# Patient Record
Sex: Male | Born: 1955 | Race: Black or African American | Hispanic: No | Marital: Married | Smoking: Former smoker
Health system: Southern US, Community
[De-identification: ages and names within clinical notes are randomized; demographics above are authoritative.]

## PROBLEM LIST (undated history)

## (undated) DIAGNOSIS — E7800 Pure hypercholesterolemia, unspecified: Secondary | ICD-10-CM

## (undated) DIAGNOSIS — M1990 Unspecified osteoarthritis, unspecified site: Secondary | ICD-10-CM

## (undated) DIAGNOSIS — K219 Gastro-esophageal reflux disease without esophagitis: Secondary | ICD-10-CM

## (undated) HISTORY — PX: KNEE SURGERY: SHX244

## (undated) HISTORY — PX: HERNIA REPAIR: SHX51

## (undated) HISTORY — PX: FOOT SURGERY: SHX648

---

## 2003-10-15 ENCOUNTER — Encounter: Admission: RE | Admit: 2003-10-15 | Discharge: 2003-10-15 | Payer: Self-pay | Admitting: Vascular Surgery

## 2004-06-26 HISTORY — PX: KNEE SURGERY: SHX244

## 2004-11-29 ENCOUNTER — Encounter: Admission: RE | Admit: 2004-11-29 | Discharge: 2004-11-29 | Payer: Self-pay | Admitting: Family Medicine

## 2005-12-21 ENCOUNTER — Encounter: Admission: RE | Admit: 2005-12-21 | Discharge: 2005-12-21 | Payer: Self-pay | Admitting: Family Medicine

## 2006-01-08 ENCOUNTER — Emergency Department (HOSPITAL_COMMUNITY): Admission: EM | Admit: 2006-01-08 | Discharge: 2006-01-08 | Payer: Self-pay | Admitting: Emergency Medicine

## 2006-03-13 ENCOUNTER — Ambulatory Visit (HOSPITAL_BASED_OUTPATIENT_CLINIC_OR_DEPARTMENT_OTHER): Admission: RE | Admit: 2006-03-13 | Discharge: 2006-03-13 | Payer: Self-pay | Admitting: Orthopedic Surgery

## 2007-09-02 ENCOUNTER — Ambulatory Visit (HOSPITAL_BASED_OUTPATIENT_CLINIC_OR_DEPARTMENT_OTHER): Admission: RE | Admit: 2007-09-02 | Discharge: 2007-09-02 | Payer: Self-pay | Admitting: Urology

## 2010-11-08 NOTE — Op Note (Signed)
Edwin [name], Edwin [name]               ACCOUNT NO.:  [id number]   MEDICAL RECORD NO.:  [id number]          PATIENT TYPE:  AMB   LOCATION:  [facility]                         FACILITY:  [facility]   PHYSICIAN:  Bertram Millard. Dahlstedt, M.D.DATE OF BIRTH:  09/16/1955   DATE OF PROCEDURE:  09/02/2007  DATE OF DISCHARGE:                               OPERATIVE REPORT   PREOPERATIVE DIAGNOSIS:  Phimosis.   POSTOPERATIVE DIAGNOSIS:  Phimosis.   PROCEDURE:  Circumcision.   ATTENDING PHYSICIAN:  Dr. Bertram Millard. Dahlstedt.   RESIDENT PHYSICIAN:  Dr. Nira Conn.   ANESTHESIA:  Local plus sedation.   INDICATIONS FOR PROCEDURE:  [name] is a 55 year old  African/American male who is uncircumcised.  He states that he has been  having difficulty retracting his foreskin and also has issues with  tearing of his foreskin during sexual intercourse, which requires one  week to two weeks to heal.  There is no history of any urinary  difficulty.  He was seen by Dr. Retta Diones preoperatively and recommended  to have a circumcision.  All risks and benefits were explained to the  patient, including bleeding, infection, damage to the urethra, removal  of too little or too much foreskin.  An informed consent was obtained.   DESCRIPTION OF PROCEDURE:  The patient is brought to the operating room  and placed in the supine position.  He was correctly identified by his  wrist band and an appropriate time out was taken.  Sedation anesthesia  was delivered.  Once adequately anesthetized, his perineum was prepped  and draped sterilely.  We began our procedure by performing a dorsal  nerve block.  We then closely inspected the phallus and made our  proximal and distal markings for incisions.  We tested his sensation  with a pair of pickups.  It appeared as if his dorsal block was  incomplete and likewise we injected some lidocaine locally at the level  of the distal incision line.  Once adequately anesthetized, we then  performed a distal circumcising incision and bluntly dissected the  superficial fascial layers and subsequently performed a proximal  circumcising incision as well.  A tissue plane was created with blunt  dissection, connecting the proximal and distal incisions on the dorsal  surface.  This was connected using Bovie electrocautery.  We then  removed the intervening segment of foreskin with electrocautery.  We  then passed the foreskin off the table.  Then we addressed any bleeding  sites with electrocautery.  Once the surgical site was hemostatic, we  then placed four quadrant sutures at 12, 3, 6 and 9 o'clock.  On the  ventral surface we reapproximated the midline with a U-stitch.  The  others were simple interrupted sutures using #4-0 chromic.  We then  closed all the wounds in between the quadrant sutures with simple  running sutures, using #4-0 chromic.  Once the wound was closed, we re-  inspected the incision.  It was hemostatic.  Subsequently we placed a  dressing using Vaseline gauze and regular cotton gauze.  This marked the end of our procedure.  Anesthesia  was stopped.  He awoke  and was taken to the PACU in stable condition.  He tolerated the  procedure well.  There were no complications.   Dr. Retta Diones was present and participated in all aspects of the case.     ______________________________  Nira Conn, Res      Bertram Millard. Dahlstedt, M.D.  Electronically Signed    DW/MEDQ  D:  09/02/2007  T:  09/02/2007  Job:  [id number]

## 2010-11-11 NOTE — Op Note (Signed)
NAMERAMEY, SCHIFF               ACCOUNT NO.:  [id number]   MEDICAL RECORD NO.:  [id number]          PATIENT TYPE:  AMB   LOCATION:  [facility]                          FACILITY:  [facility]   PHYSICIAN:  Deidre Ala, M.D.    DATE OF BIRTH:  09-Apr-1956   DATE OF PROCEDURE:  DATE OF DISCHARGE:                                 OPERATIVE REPORT   PREOPERATIVE DIAGNOSIS:  Degenerative medial and lateral meniscus tears.   POSTOPERATIVE DIAGNOSES:  1. Posterior horn degenerative stellate unstable medial and lateral      meniscus tears.  2. Tight lateral retinaculum.  3. Chondromalacia patella.  4. 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  5. Medial and lateral plicas.   OPERATION:  1. Right knee operative arthroscopy with partial posterior medial and      lateral meniscectomies.  2. Arthroscopic lateral right retinacular release.  3. Medial and lateral plica excisions.  4. Abrasion ablation chondroplasty medial femoral condyle.   SURGEON:  Doristine Section, MD   ASSISTANT:  Clarene Reamer, [facility]   ANESTHESIA:  General with LMA.   CULTURES:  None.   DRAINS:  None.   ESTIMATED BLOOD LOSS:  Minimal.   TOURNIQUET TIME:  40 minutes.   PATHOLOGIC FINDINGS AND HISTORY:  Mr. Hillyard is a 55 year old sent for  consultation by Dr. Lupe Carney of [facility] for knee pain  after a twisting injury.  He had knee pain.  We sent him for an MRI scan on  December 10, 2005.  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  There was an extensive irregular tear of the posterior horn  of the body of the lateral meniscus and a slightly less irregular tear of  the posterior horn of the medial meniscus.  He came back January 17, 2006, and  was feeling better but by February 16, 2006, he was still having pain on the  medial aspect of the knee, popping, not giving out on him but swelling  periodically, especially at the end of the day.  At this point, we discussed  his situation, and he elected to proceed with diagnostic and operative  arthroscopy.  He did have a stellate unstable posterior 1/3-1/2 medial  meniscus tear and a degenerative unstable lateral meniscus tear posterior  1/2 that debrided all the way to the rim with the popliteus still intact.  ACL was slightly stretched but not discontinuous.  There were large medial  and lateral plicas.  The trochlear cartilage looked good.  There was a plica  rubbing on the medial femoral condyle.  There was DJD grade 2-3 on the far  posterior horn medial femoral condyle.  There was grade 1 chondromalacia of  the posterior patella with a tight lateral retinaculum.  We did a lateral  retinacular release, plica excisions, abrasion chondroplasty on the medial  femoral condyle, and partial medial and lateral meniscectomies.   PROCEDURE:  With adequate anesthesia obtained using LMA  technique, 1 gram  Ancef given IV prophylaxis, the patient was placed in the supine position.  The right lower extremity was prepped from the malleoli to the leg holder in  the standard fashion.  After standard prepping and draping, Esmarch  examination was used.  The tourniquet was let up to 350 mmHg.  Superior and  lateral inflow portal was made.  The knee was insufflated with normal saline  with an arthroscopic pump.  Medial and lateral scope portals were then made,  and the joint was thoroughly inspected.  I then shaved out the medial plica  back to the sidewall and lysed the medial band.  I then exposed the medial  meniscus tear and used basket and shaver to saucerize it back to the far  posterior rim including the far posterior horn.  I then checked the ACL.  I  then reversed portals and used basket and shaver to saucerize and removed  the posterior horn lateral meniscus tear.   Lateral plica was then excised.  I then checked patellar tilt and track.  Lateral retinacular release was  carried out from vastus lateralis to the joint line with improved tracking.  Bleeding points were cauterized.  The knee was then irrigated through the  scope, and 0.5% Marcaine with morphine was injected in the joint.  The  portals were left open.  A bulky sterile compressive dressing was applied  with lateral foam pad for tamponade and Easy Wrap placed.  The patient then  having tolerated the procedure well, was awakened and taken to the recovery  room in satisfactory condition to be discharged per outpatient routine,  given Percocet for pain, and told call the office for recheck tomorrow.           ______________________________  V. Charlesetta Shanks, M.D.     VEP/MEDQ  D:  03/13/2006  T:  03/14/2006  Job:  [id number]   cc:   L. Lupe Carney, M.D.  Fax: [phone]

## 2011-03-20 LAB — POCT HEMOGLOBIN-HEMACUE
Hemoglobin: 15.1
Operator id: 268271

## 2015-02-02 ENCOUNTER — Emergency Department (HOSPITAL_COMMUNITY)
Admission: EM | Admit: 2015-02-02 | Discharge: 2015-02-03 | Disposition: A | Discharge status: Home / Self Care | Payer: Commercial Managed Care - HMO | Attending: Emergency Medicine | Admitting: Emergency Medicine

## 2015-02-02 ENCOUNTER — Encounter (HOSPITAL_COMMUNITY): Payer: Self-pay | Admitting: *Deleted

## 2015-02-02 DIAGNOSIS — Z87891 Personal history of nicotine dependence: Secondary | ICD-10-CM | POA: Diagnosis not present

## 2015-02-02 DIAGNOSIS — Z8639 Personal history of other endocrine, nutritional and metabolic disease: Secondary | ICD-10-CM | POA: Diagnosis not present

## 2015-02-02 DIAGNOSIS — M25511 Pain in right shoulder: Secondary | ICD-10-CM | POA: Diagnosis present

## 2015-02-02 HISTORY — DX: Pure hypercholesterolemia, unspecified: E78.00

## 2015-02-02 NOTE — ED Notes (Signed)
Denies SOB, CP 

## 2015-02-02 NOTE — ED Notes (Signed)
Patient presents stating that about 8 tonight he started with right shoulder pain that goes down his arm and and down his back.  Good grip

## 2015-02-02 NOTE — ED Provider Notes (Signed)
CSN: [id number]   Arrival date & time 02/02/15 2301  History  This chart was scribed for non-physician practitioner, Okey Regal PA-C , working with Ezequiel Essex, MD by Altamease Oiler, ED Scribe. This patient was seen in room TR11C/TR11C and the patient's care was started at 11:33 PM.  Chief Complaint  Patient presents with  . Shoulder Pain    HPI The history is provided by the patient. No language interpreter was used.   Edwin [name] is a 59 y.o. male who presents to the Emergency Department complaining of new and constant right shoulder pain with sudden onset around 8 PM tonight when he stood up from the dinner table. The pain radiates down the RUE and is worse with movement. He rates the pain 10/10 in severity and the hydrocodone that he usually uses for arthritis in his knees provided insufficient shoulder pain relief at home. Pt denies recent heavy lifting and trauma. No chest pain, SOB, abdominal pain, nausea, vomiting, or diarrhea. No cardiac history. Pt denies tobacco use. No history of gout. No history of kidney disease. He usually sees Dr. Eddie Dibbles for ortho.   Past Medical History  Diagnosis Date  . Hypercholesteremia     Past Surgical History  Procedure Laterality Date  . Knee surgery    . Hernia repair    . Foot surgery      No family history on file.  Social History  Substance Use Topics  . Smoking status: Former Research scientist (life sciences)  . Smokeless tobacco: Never Used  . Alcohol Use: Yes     Review of Systems  All other systems reviewed and are negative.   Home Medications   Prior to Admission medications   Not on File    Allergies  Percocet  Triage Vitals: BP 135/70 mmHg  Pulse 78  Temp(Src) 98.9 F (37.2 C) (Oral)  Resp 16  Ht 6' (1.829 m)  Wt 228 lb (103.42 kg)  BMI 30.92 kg/m2  SpO2 95%  Physical Exam  Constitutional: He is oriented to person, place, and time. He appears well-developed and well-nourished.  HENT:  Head: Normocephalic.  Eyes: EOM are  normal.  Neck: Normal range of motion.  Pulmonary/Chest: Effort normal.  Abdominal: He exhibits no distension.  Musculoskeletal: Normal range of motion.  Shoulders symmetrical No obvious right shoulder swelling or deformity TTP of anterior aspect of the right shoulder Significant pain with any ROM of the shoulder Sensation grossly intact Radial pulses 2+ Grip strength 5/5 Capillary refill less than 2 seconds  Neurological: He is alert and oriented to person, place, and time.  Psychiatric: He has a normal mood and affect.  Nursing note and vitals reviewed.   ED Course  Procedures  Labs Review- Labs Reviewed - No data to display  Imaging Review No results found.  EKG Interpretation None      MDM   Final diagnoses:  Right shoulder pain     Labs  Imaging: Right shoulder XR- patient originally requested and then denied this  Consults   Therapeutics:   Assessment: Patient presents with right shoulder pain, this is likely muscular in nature. He has no cardiac symptoms, pain worsened with range of motion of the shoulder. Distal sensation and strength in function intact of the extremity. Patient was instructed to use rest, ice, ibuprofen or Tylenol as needed for pain. He is instructed follow-up with his primary care provider orthopedic surgeon for further evaluation and management. He was given strict return precautions. Patient originally requested an x-ray, but  refused this at the time of evaluation.  Plan:  I personally performed the services described in this documentation, which was scribed in my presence. The recorded information has been reviewed and is accurate.     Okey Regal, PA-C 02/03/15 1609  Everlene Balls, MD 02/03/15 [phone]

## 2015-02-03 ENCOUNTER — Emergency Department (HOSPITAL_COMMUNITY): Payer: Commercial Managed Care - HMO

## 2015-02-03 NOTE — ED Notes (Signed)
Pt. Left with all belongings and refused wheelchair. Discharge instructions were reviewed and all questions were answered.  

## 2015-02-03 NOTE — Discharge Instructions (Signed)
Arthralgia Arthralgia is joint pain. A joint is a place where two bones meet. Joint pain can happen for many reasons. The joint can be bruised, stiff, infected, or weak from aging. Pain usually goes away after resting and taking medicine for soreness.  HOME CARE  Rest the joint as told by your doctor.  Keep the sore joint raised (elevated) for the first 24 hours.  Put ice on the joint area.  Put ice in a plastic bag.  Place a towel between your skin and the bag.  Leave the ice on for 15-20 minutes, 03-04 times a day.  Wear your splint, casting, elastic bandage, or sling as told by your doctor.  Only take medicine as told by your doctor. Do not take aspirin.  Use crutches as told by your doctor. Do not put weight on the joint until told to by your doctor. GET HELP RIGHT AWAY IF:   You have bruising, puffiness (swelling), or more pain.  Your fingers or toes turn blue or start to lose feeling (numb).  Your medicine does not lessen the pain.  Your pain becomes severe.  You have a temperature by mouth above 102 F (38.9 C), not controlled by medicine.  You cannot move or use the joint. MAKE SURE YOU:   Understand these instructions.  Will watch your condition.  Will get help right away if you are not doing well or get worse. Document Released: 05/31/2009 Document Revised: 09/04/2011 Document Reviewed: 05/31/2009 [employer] Patient Information 2015 [place], [place]. This information is not intended to replace advice given to you by your health care provider. Make sure you discuss any questions you have with your health care provider.  Please follow-up with orthopedic specialist for further evaluation and management. Please use ibuprofen or Tylenol as needed for the pain. Please return immediately if new worsening signs or symptoms present.

## 2016-03-16 ENCOUNTER — Other Ambulatory Visit: Payer: Self-pay | Admitting: Family Medicine

## 2016-03-16 ENCOUNTER — Ambulatory Visit
Admission: RE | Admit: 2016-03-16 | Discharge: 2016-03-16 | Disposition: A | Discharge status: Home / Self Care | Payer: Commercial Managed Care - HMO | Source: Ambulatory Visit | Attending: Family Medicine | Admitting: Family Medicine

## 2016-03-16 DIAGNOSIS — M79644 Pain in right finger(s): Secondary | ICD-10-CM

## 2016-07-25 ENCOUNTER — Encounter (HOSPITAL_COMMUNITY): Payer: Self-pay

## 2016-07-25 ENCOUNTER — Emergency Department (HOSPITAL_COMMUNITY): Payer: Commercial Managed Care - HMO

## 2016-07-25 ENCOUNTER — Telehealth (HOSPITAL_COMMUNITY): Payer: Self-pay | Admitting: *Deleted

## 2016-07-25 ENCOUNTER — Emergency Department (HOSPITAL_COMMUNITY)
Admission: EM | Admit: 2016-07-25 | Discharge: 2016-07-25 | Disposition: A | Discharge status: Home / Self Care | Payer: Commercial Managed Care - HMO | Attending: Emergency Medicine | Admitting: Emergency Medicine

## 2016-07-25 DIAGNOSIS — M5416 Radiculopathy, lumbar region: Secondary | ICD-10-CM | POA: Diagnosis not present

## 2016-07-25 DIAGNOSIS — R079 Chest pain, unspecified: Secondary | ICD-10-CM | POA: Diagnosis not present

## 2016-07-25 DIAGNOSIS — Z87891 Personal history of nicotine dependence: Secondary | ICD-10-CM | POA: Insufficient documentation

## 2016-07-25 DIAGNOSIS — I4891 Unspecified atrial fibrillation: Secondary | ICD-10-CM | POA: Insufficient documentation

## 2016-07-25 DIAGNOSIS — Z7901 Long term (current) use of anticoagulants: Secondary | ICD-10-CM | POA: Insufficient documentation

## 2016-07-25 DIAGNOSIS — R000 Tachycardia, unspecified: Secondary | ICD-10-CM | POA: Diagnosis not present

## 2016-07-25 DIAGNOSIS — Z79899 Other long term (current) drug therapy: Secondary | ICD-10-CM | POA: Insufficient documentation

## 2016-07-25 LAB — CBC
HEMATOCRIT: 46.4 % (ref 39.0–52.0)
HEMOGLOBIN: 15.5 g/dL (ref 13.0–17.0)
MCH: 27.9 pg (ref 26.0–34.0)
MCHC: 33.4 g/dL (ref 30.0–36.0)
MCV: 83.5 fL (ref 78.0–100.0)
Platelets: 207 10*3/uL (ref 150–400)
RBC: 5.56 MIL/uL (ref 4.22–5.81)
RDW: 13.7 % (ref 11.5–15.5)
WBC: 7.6 10*3/uL (ref 4.0–10.5)

## 2016-07-25 LAB — BASIC METABOLIC PANEL
ANION GAP: 10 (ref 5–15)
BUN: 18 mg/dL (ref 6–20)
CO2: 25 mmol/L (ref 22–32)
Calcium: 9.3 mg/dL (ref 8.9–10.3)
Chloride: 104 mmol/L (ref 101–111)
Creatinine, Ser: 1.39 mg/dL — ABNORMAL HIGH (ref 0.61–1.24)
GFR calc Af Amer: >60 mL/min (ref >60)
GFR, EST NON AFRICAN AMERICAN: 53 mL/min — AB (ref >60)
GLUCOSE: 149 mg/dL — AB (ref 65–99)
POTASSIUM: 4.2 mmol/L (ref 3.5–5.1)
Sodium: 139 mmol/L (ref 135–145)

## 2016-07-25 LAB — TROPONIN I: Troponin I: <0.03 ng/mL (ref <0.03)

## 2016-07-25 LAB — MAGNESIUM: MAGNESIUM: 2 mg/dL (ref 1.7–2.4)

## 2016-07-25 MED ORDER — DILTIAZEM HCL ER COATED BEADS 240 MG PO CP24: 240.0000 mg | ORAL_CAPSULE | Freq: Every day | ORAL | 0 refills | Status: DC

## 2016-07-25 MED ORDER — RIVAROXABAN 20 MG PO TABS
20.0000 mg | ORAL_TABLET | Freq: Once | ORAL | Status: AC
  Administered 2016-07-25: 20 mg via ORAL
  Filled 2016-07-25: qty 1

## 2016-07-25 MED ORDER — RIVAROXABAN 20 MG PO TABS: 20.0000 mg | ORAL_TABLET | Freq: Every day | ORAL | 0 refills | Status: DC

## 2016-07-25 MED ORDER — DILTIAZEM HCL 25 MG/5ML IV SOLN
20.0000 mg | Freq: Once | INTRAVENOUS | Status: AC
Start: 2016-07-25 — End: 2016-07-25
  Administered 2016-07-25: 20 mg via INTRAVENOUS
  Filled 2016-07-25: qty 5

## 2016-07-25 MED ORDER — RIVAROXABAN (XARELTO) EDUCATION KIT FOR AFIB PATIENTS
PACK | Freq: Once | Status: AC
  Administered 2016-07-25: 04:00:00
  Filled 2016-07-25: qty 1

## 2016-07-25 NOTE — ED Triage Notes (Signed)
Pt complining of fast heart rate. Pt placed on EKG at triage, tachy in the 140's, reading a-fib. Pt states no hx of same. Pt denies any recent fevers or illnesses. Pt denies any injury/trauma. Pt denies any ETOH or drug use.

## 2016-07-25 NOTE — Telephone Encounter (Signed)
I left msg on pt spouse number.   Pt number rang to [facility] but mailbox was full and not accepting msgs.

## 2016-07-25 NOTE — Telephone Encounter (Signed)
-----   Message from Juluis Mire, RN sent at 07/25/2016  2:21 PM EST -----   ----- Message ----- From: Sherran Needs, NP Sent: 07/25/2016  10:14 AM To: Juluis Mire, RN  Patient was seen in the ED for a fib with RVR for 5 days.  I spoke with Dr. Kirk Ruths who recs for diltizaem, xarelto, and clinic fu within 48 hours.  Labs normal here.   Patient now telling me this is his best contact phone number  [phone]   Wife cell:  [phone]     Thank you for your assistance with this!   Larena Glassman

## 2016-07-25 NOTE — Discharge Instructions (Signed)
Information on my medicine - XARELTO (Rivaroxaban)  This medication education was reviewed with me or my healthcare representative as part of my discharge preparation.  The pharmacist that spoke with me during my hospital stay was:  Sindy Guadeloupe, [facility]  Why was Xarelto prescribed for you? Xarelto was prescribed for you to reduce the risk of a blood clot forming that can cause a stroke if you have a medical condition called atrial fibrillation (a type of irregular heartbeat).  What do you need to know about xarelto ? Take your Xarelto ONCE DAILY at the same time every day with your evening meal. If you have difficulty swallowing the tablet whole, you may crush it and mix in applesauce just prior to taking your dose.  Take Xarelto exactly as prescribed by your doctor and DO NOT stop taking Xarelto without talking to the doctor who prescribed the medication.  Stopping without other stroke prevention medication to take the place of Xarelto may increase your risk of developing a clot that causes a stroke.  Refill your prescription before you run out.  After discharge, you should have regular check-up appointments with your healthcare provider that is prescribing your Xarelto.  In the future your dose may need to be changed if your kidney function or weight changes by a significant amount.  What do you do if you miss a dose? If you are taking Xarelto ONCE DAILY and you miss a dose, take it as soon as you remember on the same day then continue your regularly scheduled once daily regimen the next day. Do not take two doses of Xarelto at the same time or on the same day.   Important Safety Information A possible side effect of Xarelto is bleeding. You should call your healthcare provider right away if you experience any of the following: ? Bleeding from an injury or your nose that does not stop. ? Unusual colored urine (red or dark brown) or unusual colored stools (red or  black). ? Unusual bruising for unknown reasons. ? A serious fall or if you hit your head (even if there is no bleeding).  Some medicines may interact with Xarelto and might increase your risk of bleeding while on Xarelto. To help avoid this, consult your healthcare provider or pharmacist prior to using any new prescription or non-prescription medications, including herbals, vitamins, non-steroidal anti-inflammatory drugs (NSAIDs) and supplements.  This website has more information on Xarelto: [email or web address]

## 2016-07-25 NOTE — ED Notes (Signed)
Waiting for pharmacy to do education.

## 2016-07-25 NOTE — ED Provider Notes (Addendum)
[facility] DEPT Provider Note   CSN: [id number] Arrival date & time: 07/25/16  0121   By signing my name below, I, Eunice Blase, attest that this documentation has been prepared under the direction and in the presence of Everlene Balls, MD. Electronically signed, Eunice Blase, ED Scribe. 07/25/16. 2:27 AM.   History   Chief Complaint Chief Complaint  Patient presents with  . Atrial Fibrillation   The history is provided by the patient and medical records. No language interpreter was used.    HPI Comments: Edwin [name] is a 61 y.o. male with Hx of HLD who presents to the Emergency Department complaining of new, persistent heart palpitations ~4-5 days. Triage note reports tachycardia and A-fib prior to evaluation. He reports mild, occasional episodic chest pain described as "twinges" that subside without intervention. He further reveals he takes Lipitor for HLD and etodolac for pain at home. Pt denies rhinorrhea, cough, fever, N/V/D and Hx of heart problems.no history of this in the past.  Past Medical History:  Diagnosis Date  . Hypercholesteremia     There are no active problems to display for this patient.   Past Surgical History:  Procedure Laterality Date  . FOOT SURGERY    . HERNIA REPAIR    . KNEE SURGERY         Home Medications    Prior to Admission medications   Medication Sig Start Date End Date Taking? Authorizing Provider  atorvastatin (LIPITOR) 20 MG tablet Take 20 mg by mouth daily. 06/28/16  Yes Historical Provider, MD  CIALIS 5 MG tablet Take 10-20 mg by mouth daily as needed for erectile dysfunction.  07/17/16  Yes Historical Provider, MD  etodolac (LODINE) 500 MG tablet Take 500 mg by mouth 2 (two) times daily as needed (for pain).  07/13/16  Yes Historical Provider, MD  diltiazem (CARTIA XT) 240 MG 24 hr capsule Take 1 capsule (240 mg total) by mouth daily. 07/25/16   Everlene Balls, MD  rivaroxaban (XARELTO) 20 MG TABS tablet Take 1 tablet (20 mg  total) by mouth daily with supper. 07/25/16   Everlene Balls, MD    Family History History reviewed. No pertinent family history.  Social History Social History  Substance Use Topics  . Smoking status: Former Research scientist (life sciences)  . Smokeless tobacco: Never Used  . Alcohol use Yes     Allergies   Percocet [oxycodone-acetaminophen]   Review of Systems Review of Systems  All other systems reviewed and are negative.  A complete 10 system review of systems was obtained and all systems are negative except as noted in the HPI and PMH.    Physical Exam Updated Vital Signs BP (!) 141/110   Pulse (!) 140   Temp 98.4 F (36.9 C) (Oral)   Resp 18   SpO2 96%   Physical Exam  Constitutional: He is oriented to person, place, and time. Vital signs are normal. He appears well-developed and well-nourished.  Non-toxic appearance. He does not appear ill. No distress.  HENT:  Head: Normocephalic and atraumatic.  Nose: Nose normal.  Mouth/Throat: Oropharynx is clear and moist. No oropharyngeal exudate.  Eyes: Conjunctivae and EOM are normal. Pupils are equal, round, and reactive to light. No scleral icterus.  Neck: Normal range of motion. Neck supple. No tracheal deviation, no edema, no erythema and normal range of motion present. No thyroid mass and no thyromegaly present.  Cardiovascular: S1 normal, S2 normal, normal heart sounds, intact distal pulses and normal pulses.  An irregularly irregular  rhythm present. Tachycardia present.  Exam reveals no gallop and no friction rub.   No murmur heard. Pulmonary/Chest: Effort normal and breath sounds normal. No respiratory distress. He has no wheezes. He has no rhonchi. He has no rales.  Abdominal: Soft. Normal appearance and bowel sounds are normal. He exhibits no distension, no ascites and no mass. There is no hepatosplenomegaly. There is no tenderness. There is no rebound, no guarding and no CVA tenderness.  Musculoskeletal: Normal range of motion. He exhibits  no edema or tenderness.  Lymphadenopathy:    He has no cervical adenopathy.  Neurological: He is alert and oriented to person, place, and time. He has normal strength. No cranial nerve deficit or sensory deficit.  Skin: Skin is warm, dry and intact. No petechiae and no rash noted. He is not diaphoretic. No erythema. No pallor.  Nursing note and vitals reviewed.    ED Treatments / Results  DIAGNOSTIC STUDIES: Oxygen Saturation is 96% on RA, adequate by my interpretation.    COORDINATION OF CARE: 2:15 AM Discussed treatment plan with pt at bedside and pt agreed to plan. Will order labs, XR and medications then reassess.  Labs (all labs ordered are listed, but only abnormal results are displayed) Labs Reviewed  BASIC METABOLIC PANEL - Abnormal; Notable for the following:       Result Value   Glucose, Bld 149 (*)    Creatinine, Ser 1.39 (*)    GFR calc non Af Amer 53 (*)    All other components within normal limits  CBC  TROPONIN I  MAGNESIUM    EKG  EKG Interpretation  Date/Time:  Tuesday July 25 2016 02:30:18 EST Ventricular Rate:  73 PR Interval:    QRS Duration: 82 QT Interval:  349 QTC Calculation: 385 R Axis:   49 Text Interpretation:  Atrial fibrillation RVR has resolved Confirmed by Glynn Octave [phone]) on 07/25/2016 2:32:47 AM       Radiology Dg Chest 2 View  Result Date: 07/25/2016 CLINICAL DATA:  Left upper chest pain tonight.  Tachycardia. EXAM: CHEST  2 VIEW COMPARISON:  02/05/2007 FINDINGS: The heart size and mediastinal contours are within normal limits. Both lungs are clear. The visualized skeletal structures are unremarkable. IMPRESSION: No active cardiopulmonary disease. Electronically Signed   By: Andreas Newport M.D.   On: 07/25/2016 01:58    Procedures Procedures (including critical care time)  Medications Ordered in ED Medications  diltiazem (CARDIZEM) injection 20 mg (20 mg Intravenous Given 07/25/16 0225)     Initial  Impression / Assessment and Plan / ED Course  I have reviewed the triage vital signs and the nursing notes.  Pertinent labs & imaging results that were available during my care of the patient were reviewed by me and considered in my medical decision making (see chart for details).      Patient presents to the ED for a fib with RVR.  He is stable.  He was given 20mg  dilt and his rate is now in the 80s.  He feels much better.  I spoke with Dr. Kirk Ruths who recs for dilt 140 daily, xarelto 20mg  daily, and to send trish a message for fu in clinic within 48 hours.  This was done and explained to the patient.  First dose of blood thinner given in the ED.  He appears well and in NAD.  Rate continues to be normal.  VS remain within his normal limits and he is safe for DC.  CRITICAL CARE Performed  by: Laquetta Racey   Total critical care time: 45 minutes - a fib with RVR, given IV dilt  Critical care time was exclusive of separately billable procedures and treating other patients.  Critical care was necessary to treat or prevent imminent or life-threatening deterioration.  Critical care was time spent personally by me on the following activities: development of treatment plan with patient and/or surrogate as well as nursing, discussions with consultants, evaluation of patient's response to treatment, examination of patient, obtaining history from patient or surrogate, ordering and performing treatments and interventions, ordering and review of laboratory studies, ordering and review of radiographic studies, pulse oximetry and re-evaluation of patient's condition.     I personally performed the services described in this documentation, which was scribed in my presence. The recorded information has been reviewed and is accurate.     Final Clinical Impressions(s) / ED Diagnoses   Final diagnoses:  Atrial fibrillation with RVR (HCC)    New Prescriptions New Prescriptions   DILTIAZEM (CARTIA XT) 240  MG 24 HR CAPSULE    Take 1 capsule (240 mg total) by mouth daily.   RIVAROXABAN (XARELTO) 20 MG TABS TABLET    Take 1 tablet (20 mg total) by mouth daily with supper.     Everlene Balls, MD 07/25/16 [id number]    Everlene Balls, MD 07/25/16 1536

## 2016-07-26 ENCOUNTER — Encounter (HOSPITAL_COMMUNITY): Payer: Self-pay | Admitting: Nurse Practitioner

## 2016-07-26 ENCOUNTER — Ambulatory Visit (HOSPITAL_COMMUNITY)
Admission: RE | Admit: 2016-07-26 | Discharge: 2016-07-26 | Disposition: A | Discharge status: Home / Self Care | Payer: Commercial Managed Care - HMO | Source: Ambulatory Visit | Attending: Nurse Practitioner | Admitting: Nurse Practitioner

## 2016-07-26 VITALS — BP 122/84 | HR 72 | Ht 72.0 in | Wt 238.0 lb

## 2016-07-26 DIAGNOSIS — I4891 Unspecified atrial fibrillation: Secondary | ICD-10-CM | POA: Insufficient documentation

## 2016-07-26 DIAGNOSIS — I480 Paroxysmal atrial fibrillation: Secondary | ICD-10-CM

## 2016-07-26 DIAGNOSIS — Z9889 Other specified postprocedural states: Secondary | ICD-10-CM | POA: Diagnosis not present

## 2016-07-26 DIAGNOSIS — E7800 Pure hypercholesterolemia, unspecified: Secondary | ICD-10-CM | POA: Diagnosis not present

## 2016-07-26 DIAGNOSIS — Z7901 Long term (current) use of anticoagulants: Secondary | ICD-10-CM | POA: Insufficient documentation

## 2016-07-26 DIAGNOSIS — Z79899 Other long term (current) drug therapy: Secondary | ICD-10-CM | POA: Diagnosis not present

## 2016-07-26 DIAGNOSIS — Z87891 Personal history of nicotine dependence: Secondary | ICD-10-CM | POA: Insufficient documentation

## 2016-07-26 DIAGNOSIS — Z885 Allergy status to narcotic agent status: Secondary | ICD-10-CM | POA: Insufficient documentation

## 2016-07-26 NOTE — Progress Notes (Signed)
Primary Care Physician: [facility] @ [facility] Referring Physician: [facility] f/u   Samel L Krammer is a 61 y.o. male with a h/o paroxysmal afib that presented to ER with 5 days of irregular heart beat that is in the afib clinic for f/u. He was started on Cardizem and xarelto with a chadsvasc scote of 0. He left the ER in afib with cvr and is now in SR. He states that he has noted short burst of irregular heart beat over the last few months. He did not notice any fatigue or shortness of breath with the afib. HR was 130's on presentation and had HR in the 70's on d/c from ER.   Review of lifestyle reveals no tobacco or caffeine. He does drink alcohol 1-2 drinks a night. He is active. He does snore at times but has not reported apnea episodes per wife and does have have any daytime symptoms of sleep apnea.   Today, he denies symptoms of palpitations, chest pain, shortness of breath, orthopnea, PND, lower extremity edema, dizziness, presyncope, syncope, or neurologic sequela. The patient is tolerating medications without difficulties and is otherwise without complaint today.   Past Medical History:  Diagnosis Date  . Hypercholesteremia    Past Surgical History:  Procedure Laterality Date  . FOOT SURGERY    . HERNIA REPAIR    . KNEE SURGERY      Current Outpatient Prescriptions  Medication Sig Dispense Refill  . atorvastatin (LIPITOR) 20 MG tablet Take 20 mg by mouth daily.  3  . CIALIS 5 MG tablet Take 10-20 mg by mouth daily as needed for erectile dysfunction.   7  . diltiazem (CARTIA XT) 240 MG 24 hr capsule Take 1 capsule (240 mg total) by mouth daily. 30 capsule 0  . rivaroxaban (XARELTO) 20 MG TABS tablet Take 1 tablet (20 mg total) by mouth daily with supper. 30 tablet 0   No current facility-administered medications for this encounter.     Allergies  Allergen Reactions  . Percocet [Oxycodone-Acetaminophen] Swelling    Social History   Social History  .  Marital status: Married    Spouse name: N/A  . Number of children: N/A  . Years of education: N/A   Occupational History  . Not on file.   Social History Main Topics  . Smoking status: Former Research scientist (life sciences)  . Smokeless tobacco: Never Used  . Alcohol use Yes  . Drug use: No  . Sexual activity: Not on file   Other Topics Concern  . Not on file   Social History Narrative  . No narrative on file    No family history on file.  ROS- All systems are reviewed and negative except as per the HPI above  Physical Exam: Vitals:   07/26/16 1055  BP: 122/84  Pulse: 72  Weight: 238 lb (108 kg)  Height: 6' (1.829 m)   Wt Readings from Last 3 Encounters:  07/26/16 238 lb (108 kg)  02/02/15 228 lb (103.4 kg)    Labs: Lab Results  Component Value Date   NA 139 07/25/2016   K 4.2 07/25/2016   CL 104 07/25/2016   CO2 25 07/25/2016   GLUCOSE 149 (H) 07/25/2016   BUN 18 07/25/2016   CREATININE 1.39 (H) 07/25/2016   CALCIUM 9.3 07/25/2016   MG 2.0 07/25/2016   No results found for: INR No results found for: CHOL, HDL, LDLCALC, TRIG   GEN- The patient is well appearing, alert and oriented x  3 today.   Head- normocephalic, atraumatic Eyes-  Sclera clear, conjunctiva pink Ears- hearing intact Oropharynx- clear Neck- supple, no JVP Lymph- no cervical lymphadenopathy Lungs- Clear to ausculation bilaterally, normal work of breathing Heart- Regular rate and rhythm, no murmurs, rubs or gallops, PMI not laterally displaced GI- soft, NT, ND, + BS Extremities- no clubbing, cyanosis, or edema MS- no significant deformity or atrophy Skin- no rash or lesion Psych- euthymic mood, full affect Neuro- strength and sensation are intact  EKG- SR with PAC's with v rate of 72 bpm, pr int 154 ms, qrs int 82 ms, qtc 413 ms Epic records reviewed    Assessment and Plan: 1. afib Back in SR General education re afib Continue cardizem 240 mg a day Continue xarelto for now with a chadsvasc score  of 0, probably will not require long term Echo  2. Lifestyle risk factors Decrease alcohol to no more than 2 a week Pt now thinks it may have come on after having several drinks over the previous weekend Continue physical activity Is already avoiding caffeine Denies daytime symptoms of afib although has been known to snore  F/u in 3 weeks  Butch Penny C. Merin Borjon, [facility] [address] [place], [place] [address] [phone]

## 2016-08-03 DIAGNOSIS — I4891 Unspecified atrial fibrillation: Secondary | ICD-10-CM | POA: Diagnosis not present

## 2016-08-04 ENCOUNTER — Ambulatory Visit (HOSPITAL_COMMUNITY)
Admission: RE | Admit: 2016-08-04 | Discharge: 2016-08-04 | Disposition: A | Discharge status: Home / Self Care | Payer: Commercial Managed Care - HMO | Source: Ambulatory Visit | Attending: Nurse Practitioner | Admitting: Nurse Practitioner

## 2016-08-04 DIAGNOSIS — I480 Paroxysmal atrial fibrillation: Secondary | ICD-10-CM | POA: Diagnosis not present

## 2016-08-04 DIAGNOSIS — I700 Atherosclerosis of aorta: Secondary | ICD-10-CM | POA: Insufficient documentation

## 2016-08-04 DIAGNOSIS — I517 Cardiomegaly: Secondary | ICD-10-CM | POA: Insufficient documentation

## 2016-08-15 ENCOUNTER — Ambulatory Visit (HOSPITAL_COMMUNITY)
Admission: RE | Admit: 2016-08-15 | Discharge: 2016-08-15 | Disposition: A | Discharge status: Home / Self Care | Payer: Commercial Managed Care - HMO | Source: Ambulatory Visit | Attending: Nurse Practitioner | Admitting: Nurse Practitioner

## 2016-08-15 ENCOUNTER — Encounter (HOSPITAL_COMMUNITY): Payer: Self-pay | Admitting: Nurse Practitioner

## 2016-08-15 VITALS — BP 120/72 | HR 62 | Ht 72.0 in | Wt 236.6 lb

## 2016-08-15 DIAGNOSIS — Z885 Allergy status to narcotic agent status: Secondary | ICD-10-CM | POA: Diagnosis not present

## 2016-08-15 DIAGNOSIS — Z87891 Personal history of nicotine dependence: Secondary | ICD-10-CM | POA: Diagnosis not present

## 2016-08-15 DIAGNOSIS — Z9889 Other specified postprocedural states: Secondary | ICD-10-CM | POA: Diagnosis not present

## 2016-08-15 DIAGNOSIS — Z79899 Other long term (current) drug therapy: Secondary | ICD-10-CM | POA: Insufficient documentation

## 2016-08-15 DIAGNOSIS — I480 Paroxysmal atrial fibrillation: Secondary | ICD-10-CM

## 2016-08-15 DIAGNOSIS — E7800 Pure hypercholesterolemia, unspecified: Secondary | ICD-10-CM | POA: Insufficient documentation

## 2016-08-15 DIAGNOSIS — Z7901 Long term (current) use of anticoagulants: Secondary | ICD-10-CM | POA: Insufficient documentation

## 2016-08-15 NOTE — Progress Notes (Signed)
Primary Care Physician: [facility] @ [facility] Referring Physician: [facility] f/u   Edwin [name] is a 61 y.o. male with a h/o paroxysmal afib that presented to ER with 5 days of irregular heart beat that is in the afib clinic for f/u. He was started on Cardizem and xarelto with a chadsvasc scote of 0. He left the ER in afib with cvr and is now in SR. He states that he has noted short burst of irregular heart beat over the last few months. He did not notice any fatigue or shortness of breath with the afib. HR was 130's on presentation and had HR in the 70's on d/c from ER.   Review of lifestyle reveals no tobacco or caffeine. He does drink alcohol 1-2 drinks a night. He is active. He does snore at times but has not reported apnea episodes per wife and does have have any daytime symptoms of sleep apnea.  F/u in the afib clinic. He had an echo that showed no significant abnormalities. He has not noticed any further afib. He has cut out alcohol. He continues the cardizem daily. He has almost finished the 30 day mandatory DOAC following cardioversion and then can stop with a CHA2DS2VASc score of 0.   Today, he denies symptoms of palpitations, chest pain, shortness of breath, orthopnea, PND, lower extremity edema, dizziness, presyncope, syncope, or neurologic sequela. The patient is tolerating medications without difficulties and is otherwise without complaint today.   Past Medical History:  Diagnosis Date  . Hypercholesteremia    Past Surgical History:  Procedure Laterality Date  . FOOT SURGERY    . HERNIA REPAIR    . KNEE SURGERY      Current Outpatient Prescriptions  Medication Sig Dispense Refill  . atorvastatin (LIPITOR) 20 MG tablet Take 20 mg by mouth daily.  3  . CIALIS 5 MG tablet Take 10-20 mg by mouth daily as needed for erectile dysfunction.   7  . diltiazem (CARTIA XT) 240 MG 24 hr capsule Take 1 capsule (240 mg total) by mouth daily. 30 capsule 0  .  Naproxen-Esomeprazole (VIMOVO) 500-20 MG TBEC Take 1 tablet by mouth daily.    Marland Kitchen pyridOXINE (VITAMIN B-6) 50 MG tablet Take 50 mg by mouth daily.    . rivaroxaban (XARELTO) 20 MG TABS tablet Take 1 tablet (20 mg total) by mouth daily with supper. 30 tablet 0   No current facility-administered medications for this encounter.     Allergies  Allergen Reactions  . Percocet [Oxycodone-Acetaminophen] Swelling    Social History   Social History  . Marital status: Married    Spouse name: N/A  . Number of children: N/A  . Years of education: N/A   Occupational History  . Not on file.   Social History Main Topics  . Smoking status: Former Research scientist (life sciences)  . Smokeless tobacco: Never Used  . Alcohol use Yes  . Drug use: No  . Sexual activity: Not on file   Other Topics Concern  . Not on file   Social History Narrative  . No narrative on file    No family history on file.  ROS- All systems are reviewed and negative except as per the HPI above  Physical Exam: Vitals:   08/15/16 1056  BP: 120/72  Pulse: 62  Weight: 236 lb 9.6 oz (107.3 kg)  Height: 6' (1.829 m)   Wt Readings from Last 3 Encounters:  08/15/16 236 lb 9.6 oz (107.3 kg)  07/26/16  238 lb (108 kg)  02/02/15 228 lb (103.4 kg)    Labs: Lab Results  Component Value Date   NA 139 07/25/2016   K 4.2 07/25/2016   CL 104 07/25/2016   CO2 25 07/25/2016   GLUCOSE 149 (H) 07/25/2016   BUN 18 07/25/2016   CREATININE 1.39 (H) 07/25/2016   CALCIUM 9.3 07/25/2016   MG 2.0 07/25/2016   No results found for: INR No results found for: CHOL, HDL, LDLCALC, TRIG   GEN- The patient is well appearing, alert and oriented x 3 today.   Head- normocephalic, atraumatic Eyes-  Sclera clear, conjunctiva pink Ears- hearing intact Oropharynx- clear Neck- supple, no JVP Lymph- no cervical lymphadenopathy Lungs- Clear to ausculation bilaterally, normal work of breathing Heart- Regular rate and rhythm, no murmurs, rubs or gallops,  PMI not laterally displaced GI- soft, NT, ND, + BS Extremities- no clubbing, cyanosis, or edema MS- no significant deformity or atrophy Skin- no rash or lesion Psych- euthymic mood, full affect Neuro- strength and sensation are intact  EKG- SR at 62 bpm, pr int  156 ms, qrs 84 ms, qtc 397 ms  Epic records reviewed Echo-Study Conclusions  - Left ventricle: The cavity size was normal. There was mild focal   basal hypertrophy of the septum. Systolic function was normal.   The estimated ejection fraction was in the range of 55% to 60%.   Wall motion was normal; there were no regional wall motion   abnormalities. Left ventricular diastolic function parameters   were normal.    Assessment and Plan: 1. Paroxsymal afib Continues in SR Continue cardizem 240 mg a day Finish 30 days xarelto  with a chadsvasc score of 0 and then stop   2. Lifestyle risk factors Continue  to avoid alcohol, thought to be a trigger for prior episode that required cardioversion Continue physical activity Continues to avoid  caffeine  afib clinic  as needed  Butch Penny C. [place], [facility] [address] [place], [place] [address] [phone]

## 2016-08-17 DIAGNOSIS — M545 Low back pain: Secondary | ICD-10-CM | POA: Diagnosis not present

## 2016-08-22 DIAGNOSIS — M5416 Radiculopathy, lumbar region: Secondary | ICD-10-CM | POA: Diagnosis not present

## 2016-10-23 DIAGNOSIS — T7840XA Allergy, unspecified, initial encounter: Secondary | ICD-10-CM | POA: Diagnosis not present

## 2016-11-26 DIAGNOSIS — J069 Acute upper respiratory infection, unspecified: Secondary | ICD-10-CM | POA: Diagnosis not present

## 2016-11-26 DIAGNOSIS — H6123 Impacted cerumen, bilateral: Secondary | ICD-10-CM | POA: Diagnosis not present

## 2016-12-21 DIAGNOSIS — H2511 Age-related nuclear cataract, right eye: Secondary | ICD-10-CM | POA: Diagnosis not present

## 2016-12-21 DIAGNOSIS — H538 Other visual disturbances: Secondary | ICD-10-CM | POA: Diagnosis not present

## 2017-01-08 DIAGNOSIS — E7800 Pure hypercholesterolemia, unspecified: Secondary | ICD-10-CM | POA: Diagnosis not present

## 2017-01-08 DIAGNOSIS — Z125 Encounter for screening for malignant neoplasm of prostate: Secondary | ICD-10-CM | POA: Diagnosis not present

## 2017-01-08 DIAGNOSIS — Z0000 Encounter for general adult medical examination without abnormal findings: Secondary | ICD-10-CM | POA: Diagnosis not present

## 2017-01-18 DIAGNOSIS — Z86010 Personal history of colonic polyps: Secondary | ICD-10-CM | POA: Diagnosis not present

## 2017-01-18 DIAGNOSIS — D126 Benign neoplasm of colon, unspecified: Secondary | ICD-10-CM | POA: Diagnosis not present

## 2017-01-18 DIAGNOSIS — K5730 Diverticulosis of large intestine without perforation or abscess without bleeding: Secondary | ICD-10-CM | POA: Diagnosis not present

## 2017-02-23 DIAGNOSIS — K429 Umbilical hernia without obstruction or gangrene: Secondary | ICD-10-CM | POA: Diagnosis not present

## 2017-02-28 DIAGNOSIS — Z23 Encounter for immunization: Secondary | ICD-10-CM | POA: Diagnosis not present

## 2017-03-14 DIAGNOSIS — E7800 Pure hypercholesterolemia, unspecified: Secondary | ICD-10-CM | POA: Diagnosis not present

## 2018-02-21 DIAGNOSIS — H2511 Age-related nuclear cataract, right eye: Secondary | ICD-10-CM | POA: Diagnosis not present

## 2018-02-21 DIAGNOSIS — H538 Other visual disturbances: Secondary | ICD-10-CM | POA: Diagnosis not present

## 2018-03-11 DIAGNOSIS — Z1159 Encounter for screening for other viral diseases: Secondary | ICD-10-CM | POA: Diagnosis not present

## 2018-03-11 DIAGNOSIS — E7800 Pure hypercholesterolemia, unspecified: Secondary | ICD-10-CM | POA: Diagnosis not present

## 2018-03-11 DIAGNOSIS — Z125 Encounter for screening for malignant neoplasm of prostate: Secondary | ICD-10-CM | POA: Diagnosis not present

## 2018-03-11 DIAGNOSIS — Z0000 Encounter for general adult medical examination without abnormal findings: Secondary | ICD-10-CM | POA: Diagnosis not present

## 2018-03-29 DIAGNOSIS — M545 Low back pain: Secondary | ICD-10-CM | POA: Diagnosis not present

## 2018-04-05 DIAGNOSIS — M47816 Spondylosis without myelopathy or radiculopathy, lumbar region: Secondary | ICD-10-CM | POA: Diagnosis not present

## 2021-05-02 ENCOUNTER — Other Ambulatory Visit: Payer: Self-pay | Admitting: Family Medicine

## 2021-05-02 DIAGNOSIS — Z87891 Personal history of nicotine dependence: Secondary | ICD-10-CM

## 2021-05-03 ENCOUNTER — Ambulatory Visit
Admission: RE | Admit: 2021-05-03 | Discharge: 2021-05-03 | Disposition: A | Discharge status: Home / Self Care | Payer: Self-pay | Source: Ambulatory Visit | Attending: Family Medicine | Admitting: Family Medicine

## 2021-05-03 DIAGNOSIS — Z87891 Personal history of nicotine dependence: Secondary | ICD-10-CM

## 2021-11-07 NOTE — Progress Notes (Signed)
GU Location of Tumor / Histology: Prostate Ca ? ?If Prostate Cancer, Gleason Score is (3 + 4) and PSA is (4.7 as of 08/2021) ? ?Biopsies: ?Dr. Diona Fanti ? ? ? ? ?Past/Anticipated interventions by urology, if any: NA ? ?Past/Anticipated interventions by medical oncology, if any: NA ? ?Weight changes, if any: No ? ?IPSS:  5 ?SHIM: 25 ? ?Bowel/Bladder complaints, if any:  Frequency and Nocturia x 2-3. ? ?Nausea/Vomiting, if any: No ? ?Pain issues, if any:  0/10 ? ?SAFETY ISSUES: ?Prior radiation?  No ?Pacemaker/ICD? No ?Possible current pregnancy? Male ?Is the patient on methotrexate?  No ? ?Current Complaints / other details:  Need more information on treatment options. ?

## 2021-11-08 ENCOUNTER — Ambulatory Visit
Admission: RE | Admit: 2021-11-08 | Discharge: 2021-11-08 | Disposition: A | Discharge status: Home / Self Care | Payer: Medicare Other | Source: Ambulatory Visit | Attending: Radiation Oncology | Admitting: Radiation Oncology

## 2021-11-08 ENCOUNTER — Other Ambulatory Visit: Payer: Self-pay

## 2021-11-08 DIAGNOSIS — K219 Gastro-esophageal reflux disease without esophagitis: Secondary | ICD-10-CM | POA: Insufficient documentation

## 2021-11-08 DIAGNOSIS — N529 Male erectile dysfunction, unspecified: Secondary | ICD-10-CM | POA: Insufficient documentation

## 2021-11-08 DIAGNOSIS — Z860100 Personal history of colon polyps, unspecified: Secondary | ICD-10-CM | POA: Insufficient documentation

## 2021-11-08 DIAGNOSIS — C61 Malignant neoplasm of prostate: Secondary | ICD-10-CM

## 2021-11-08 DIAGNOSIS — I4891 Unspecified atrial fibrillation: Secondary | ICD-10-CM | POA: Insufficient documentation

## 2021-11-08 DIAGNOSIS — H9190 Unspecified hearing loss, unspecified ear: Secondary | ICD-10-CM | POA: Insufficient documentation

## 2021-11-08 DIAGNOSIS — E7800 Pure hypercholesterolemia, unspecified: Secondary | ICD-10-CM | POA: Insufficient documentation

## 2021-11-08 DIAGNOSIS — E669 Obesity, unspecified: Secondary | ICD-10-CM | POA: Insufficient documentation

## 2021-11-08 DIAGNOSIS — M170 Bilateral primary osteoarthritis of knee: Secondary | ICD-10-CM | POA: Insufficient documentation

## 2021-11-08 NOTE — Progress Notes (Signed)
?Radiation Oncology         [phone] ?________________________________ ? ?Initial Outpatient Consultation ? ?Name: Edwin [name] MRN: [id number]  ?Date: 11/08/2021  DOB: 1956/05/18 ? ?HL:[id number], [facility] @ [facility], MD  ? ?REFERRING PHYSICIAN: Franchot Gallo, MD ? ?DIAGNOSIS: 66 y.o. gentleman with Stage T1c adenocarcinoma of the prostate with Gleason score of 3+4, and PSA of 4.67. ? ?  ICD-10-CM   ?1. Malignant neoplasm of prostate ([place])  C61   ?  ? ? ?HISTORY OF PRESENT ILLNESS: Edwin [name] is a 66 y.o. male with a diagnosis of prostate cancer. He was noted to have a rising PSA from 2.46 in 02/2019 to 3.19 in 03/2020 and  up to 4.74 on routine labs with his primary care physician, Dr. Alroy Dust.  Accordingly, he was referred for evaluation in urology by Dr. Diona Fanti on 06/29/21,  digital rectal examination was performed at that time revealing no nodules. Repeat PSA obtained that day showed persistent elevation at 4.67.  Therefore, the patient proceeded to transrectal ultrasound with 12 biopsies of the prostate on 09/02/21.  The prostate volume measured 24.06 cc.  Out of 12 core biopsies, 3 were positive.  The maximum Gleason score was 3+4, and this was seen in the right mid lateral. Additionally, Gleason 3+3 was seen in the right apex lateral and left apex lateral. ? ?The patient reviewed the biopsy results with his urologist and he has kindly been referred today for discussion of potential radiation treatment options. ? ? ?PREVIOUS RADIATION THERAPY: No ? ?PAST MEDICAL HISTORY:  ?Past Medical History:  ?Diagnosis Date  ? Hypercholesteremia   ?   ? ?PAST SURGICAL HISTORY: ?Past Surgical History:  ?Procedure Laterality Date  ? FOOT SURGERY    ? HERNIA REPAIR    ? KNEE SURGERY    ? ? ?FAMILY HISTORY: No family history on file. ? ?SOCIAL HISTORY:  ?Social History  ? ?Socioeconomic History  ? Marital status: Married  ?  Spouse name: Not on file  ? Number of children: Not on  file  ? Years of education: Not on file  ? Highest education level: Not on file  ?Occupational History  ? Not on file  ?Tobacco Use  ? Smoking status: Former  ? Smokeless tobacco: Never  ?Substance and Sexual Activity  ? Alcohol use: Yes  ? Drug use: No  ? Sexual activity: Not on file  ?Other Topics Concern  ? Not on file  ?Social History Narrative  ? Not on file  ? ?Social Determinants of Health  ? ?Financial Resource Strain: Not on file  ?Food Insecurity: Not on file  ?Transportation Needs: Not on file  ?Physical Activity: Not on file  ?Stress: Not on file  ?Social Connections: Not on file  ?Intimate Partner Violence: Not on file  ? ? ?ALLERGIES: Percocet [oxycodone-acetaminophen] ? ?MEDICATIONS:  ?Current Outpatient Medications  ?Medication Sig Dispense Refill  ? atorvastatin (LIPITOR) 20 MG tablet Take 20 mg by mouth daily.  3  ? esomeprazole (NEXIUM) 20 MG capsule 1 capsule    ? loratadine (CLARITIN) 10 MG tablet 1 tablet    ? naproxen sodium (ALEVE) 220 MG tablet 2 tablets    ? sildenafil (VIAGRA) 100 MG tablet 1/2-1 tablet    ? ?No current facility-administered medications for this encounter.  ? ? ?REVIEW OF SYSTEMS:  On review of systems, the patient reports that he is doing well overall. He denies any chest pain, shortness of breath, cough, fevers, chills,  night sweats, unintended weight changes. He denies any bowel disturbances, and denies abdominal pain, nausea or vomiting. He denies any new musculoskeletal or joint aches or pains. His IPSS was 5, indicating mild urinary symptoms. He reports urinary frequency and nocturia x2-3. His SHIM was 25, indicating his erectile dysfunction is well controlled with use of Viagra and VED as needed. A complete review of systems is obtained and is otherwise negative. ? ?  ?PHYSICAL EXAM:  ?Wt Readings from Last 3 Encounters:  ?08/15/16 236 lb 9.6 oz (107.3 kg)  ?07/26/16 238 lb (108 kg)  ?02/02/15 228 lb (103.4 kg)  ? ?Temp Readings from Last 3 Encounters:  ?07/25/16  98.4 ?F (36.9 ?C) (Oral)  ?02/03/15 98 ?F (36.7 ?C) (Oral)  ? ?BP Readings from Last 3 Encounters:  ?08/15/16 120/72  ?07/26/16 122/84  ?07/25/16 115/73  ? ?Pulse Readings from Last 3 Encounters:  ?08/15/16 62  ?07/26/16 72  ?07/25/16 86  ? ?Pain Assessment ?Pain Score: 0-No pain/10 ? ?In general this is a well appearing African-American male in no acute distress. He's alert and oriented x4 and appropriate throughout the examination. Cardiopulmonary assessment is negative for acute distress, and he exhibits normal effort.   ? ? ?KPS = 100 ? ?100 - Normal; no complaints; no evidence of disease. ?90   - Able to carry on normal activity; minor signs or symptoms of disease. ?80   - Normal activity with effort; some signs or symptoms of disease. ?33   - Cares for self; unable to carry on normal activity or to do active work. ?60   - Requires occasional assistance, but is able to care for most of his personal needs. ?50   - Requires considerable assistance and frequent medical care. ?35   - Disabled; requires special care and assistance. ?30   - Severely disabled; hospital admission is indicated although death not imminent. ?20   - Very sick; hospital admission necessary; active supportive treatment necessary. ?10   - Moribund; fatal processes progressing rapidly. ?0     - Dead ? ?Karnofsky DA, Abelmann [facility], Craver LS and Burchenal [facility] [phone]) The use of the nitrogen mustards in the palliative treatment of carcinoma: with particular reference to bronchogenic carcinoma Cancer 1 634-56 ? ?LABORATORY DATA:  ?Lab Results  ?Component Value Date  ? WBC 7.6 07/25/2016  ? HGB 15.5 07/25/2016  ? HCT 46.4 07/25/2016  ? MCV 83.5 07/25/2016  ? PLT 207 07/25/2016  ? ?Lab Results  ?Component Value Date  ? NA 139 07/25/2016  ? K 4.2 07/25/2016  ? CL 104 07/25/2016  ? CO2 25 07/25/2016  ? ?No results found for: ALT, AST, GGT, ALKPHOS, BILITOT ?  ?RADIOGRAPHY: No results found. ?   ?IMPRESSION/PLAN: ?1. 66 y.o. gentleman with Stage T1c  adenocarcinoma of the prostate with Gleason Score of 3+4, and PSA of 4.67. ?We discussed the patient's workup and outlined the nature of prostate cancer in this setting. The patient's T stage, Gleason's score, and PSA put him into the favorable intermediate risk group. Accordingly, he is eligible for a variety of potential treatment options including brachytherapy, 5.5 weeks of external radiation, or prostatectomy. We discussed the available radiation techniques, and focused on the details and logistics of delivery. We discussed and outlined the risks, benefits, short and long-term effects associated with radiotherapy and compared and contrasted these with prostatectomy. We discussed the role of SpaceOAR gel in reducing the rectal toxicity associated with radiotherapy. He appears to have a good understanding of his  disease and our treatment recommendations which are of curative intent.  He was encouraged to ask questions that were answered to his stated satisfaction. ? ?At the conclusion of our conversation, the patient is interested in moving forward with brachytherapy and use of SpaceOAR gel to reduce rectal toxicity from radiotherapy.  We will share our discussion with Dr. Diona Fanti and move forward with scheduling his [facility] planning appointment in the near future.  The patient met briefly with Romie Jumper in our office who will be working closely with him to coordinate OR scheduling and pre and post procedure appointments.  We will contact the pharmaceutical rep to ensure that [place] is available at the time of procedure.  We enjoyed meeting him today and look forward to continuing to participate in his care. ? ?We personally spent 70 minutes in this encounter including chart review, reviewing radiological studies, meeting face-to-face with the patient, entering orders and completing documentation. ? ? ? ?Nicholos Johns, PA-C  ? ? ?Tyler Pita, MD  ?[facility] ?Direct Dial:  [phone]  Fax: [phone] ?[email or web address]  Skype  LinkedIn ? ? ?This document serves as a record of services personally performed by Tyler Pita, MD and Freeman Caldron, PA-C. It was created on their be

## 2021-11-09 DIAGNOSIS — C61 Malignant neoplasm of prostate: Secondary | ICD-10-CM | POA: Insufficient documentation

## 2021-11-11 ENCOUNTER — Telehealth: Payer: Self-pay | Admitting: *Deleted

## 2021-11-11 NOTE — Telephone Encounter (Signed)
Called patient to inform of pre-seed appts. for 12-15-21 and his implant on 02-02-22, spoke with patient and he is aware of these dates

## 2021-11-14 NOTE — Progress Notes (Signed)
Received call from radiation oncology, [place], check to see if pt needs EKG prior to surgery scheduled on 02-02-2022 for radioactive prostate seed implants on 02-02-2022 by Dr Diona Fanti.  Which is ordered per anesthesia.  Noted pt does not have HTN or DM2 , however, noted document in epic in 2018 was seen by cardiologist [facility] for AFib.  So , pt will need one unless he has had another EKG somewhere else in the past year. Made appointment same day as CT sims 12-15-2021 @ 1430.

## 2021-11-14 NOTE — Progress Notes (Signed)
Patient met with Dr. Tammi Klippel and Ailene Ards, PA-C on 5/16 to discuss radiation treatment options.  Patient decided to proceed with brachytherapy and is scheduled for 02/02/2022.  I called patient to introduce myself as I was not able to meet him in person, no option to leave voicemail.

## 2021-11-28 ENCOUNTER — Other Ambulatory Visit: Payer: Self-pay | Admitting: Urology

## 2021-12-13 ENCOUNTER — Telehealth: Payer: Self-pay | Admitting: *Deleted

## 2021-12-13 NOTE — Telephone Encounter (Signed)
CALLED PATIENT TO REMIND OF PRE-SEED APPTS. FOR 12-15-21, SPOKE WITH PATIENT AND HE IS AWARE OF THESE APPTS.

## 2021-12-15 ENCOUNTER — Encounter: Payer: Self-pay | Admitting: Urology

## 2021-12-15 ENCOUNTER — Encounter (HOSPITAL_COMMUNITY)
Admission: RE | Admit: 2021-12-15 | Discharge: 2021-12-15 | Disposition: A | Discharge status: Home / Self Care | Payer: Medicare Other | Source: Ambulatory Visit | Attending: Urology | Admitting: Urology

## 2021-12-15 ENCOUNTER — Ambulatory Visit
Admission: RE | Admit: 2021-12-15 | Discharge: 2021-12-15 | Disposition: A | Discharge status: Home / Self Care | Payer: Medicare Other | Source: Ambulatory Visit | Attending: Radiation Oncology | Admitting: Radiation Oncology

## 2021-12-15 ENCOUNTER — Other Ambulatory Visit: Payer: Self-pay

## 2021-12-15 ENCOUNTER — Ambulatory Visit
Admission: RE | Admit: 2021-12-15 | Discharge: 2021-12-15 | Disposition: A | Discharge status: Home / Self Care | Payer: Medicare Other | Source: Ambulatory Visit | Attending: Urology | Admitting: Urology

## 2021-12-15 VITALS — Resp 19 | Ht 72.0 in | Wt 229.0 lb

## 2021-12-15 DIAGNOSIS — Z01818 Encounter for other preprocedural examination: Secondary | ICD-10-CM | POA: Insufficient documentation

## 2021-12-15 DIAGNOSIS — C61 Malignant neoplasm of prostate: Secondary | ICD-10-CM | POA: Insufficient documentation

## 2021-12-15 DIAGNOSIS — M170 Bilateral primary osteoarthritis of knee: Secondary | ICD-10-CM | POA: Insufficient documentation

## 2021-12-15 NOTE — Progress Notes (Signed)
Radiation Oncology         [phone] ________________________________  Outpatient Follow Up   Name: Edwin [name] MRN: [id number]  Date: 12/15/2021  DOB: 09/22/1955  [id number], [facility] @ [facility], MD   REFERRING PHYSICIAN: Franchot Gallo, MD  DIAGNOSIS: 66 y.o. gentleman with Stage T1c adenocarcinoma of the prostate with Gleason score of 3+4, and PSA of 4.67.    ICD-10-CM   1. Malignant neoplasm of prostate ([place])  C61       HISTORY OF PRESENT ILLNESS: Edwin [name] is a 66 y.o. male with a diagnosis of prostate cancer. He was noted to have a rising PSA from 2.46 in 02/2019 to 3.19 in 03/2020 and  up to 4.74 on routine labs with his primary care physician, Dr. Alroy Dust.  Accordingly, he was referred for evaluation in urology by Dr. Diona Fanti on 06/29/21,  digital rectal examination was performed at that time revealing no nodules. Repeat PSA obtained that day showed persistent elevation at 4.67.  Therefore, the patient proceeded to transrectal ultrasound with 12 biopsies of the prostate on 09/02/21.  The prostate volume measured 24.06 cc.  Out of 12 core biopsies, 3 were positive.  The maximum Gleason score was 3+4, and this was seen in the right mid lateral. Additionally, Gleason 3+3 was seen in the right apex lateral and left apex lateral.  We initially met with the patient on 11/08/2021 for discussion of potential radiation treatment options and he has decided to proceed with brachytherapy with SpaceOAR gel placement which is scheduled for 02/02/2022.  He is here today for his preseed visit to review side effects, answer questions regarding the procedure and sign consent.   PREVIOUS RADIATION THERAPY: No  PAST MEDICAL HISTORY:  Past Medical History:  Diagnosis Date   Hypercholesteremia       PAST SURGICAL HISTORY: Past Surgical History:  Procedure Laterality Date   FOOT SURGERY     HERNIA REPAIR     KNEE SURGERY      FAMILY HISTORY:  History reviewed. No pertinent family history.  SOCIAL HISTORY:  Social History   Socioeconomic History   Marital status: Married    Spouse name: Not on file   Number of children: Not on file   Years of education: Not on file   Highest education level: Not on file  Occupational History   Not on file  Tobacco Use   Smoking status: Former   Smokeless tobacco: Never  Substance and Sexual Activity   Alcohol use: Yes   Drug use: No   Sexual activity: Not on file  Other Topics Concern   Not on file  Social History Narrative   Not on file   Social Determinants of Health   Financial Resource Strain: Not on file  Food Insecurity: Not on file  Transportation Needs: Not on file  Physical Activity: Not on file  Stress: Not on file  Social Connections: Not on file  Intimate Partner Violence: Not on file    ALLERGIES: Percocet [oxycodone-acetaminophen]  MEDICATIONS:  Current Outpatient Medications  Medication Sig Dispense Refill   atorvastatin (LIPITOR) 20 MG tablet Take 20 mg by mouth daily.  3   esomeprazole (NEXIUM) 20 MG capsule 1 capsule     loratadine (CLARITIN) 10 MG tablet 1 tablet     naproxen sodium (ALEVE) 220 MG tablet 2 tablets     sildenafil (VIAGRA) 100 MG tablet 1/2-1 tablet     No current facility-administered medications for this  encounter.    REVIEW OF SYSTEMS:  On review of systems, the patient reports that he is doing well overall. His IPSS obtained at the time of his initial visit on 11/08/2021 was 5, indicating mild urinary symptoms with urinary frequency and nocturia x2-3. His SHIM was 25, indicating his erectile dysfunction is well controlled with use of Viagra and VED as needed.    PHYSICAL EXAM:  Wt Readings from Last 3 Encounters:  12/15/21 229 lb (103.9 kg)  08/15/16 236 lb 9.6 oz (107.3 kg)  07/26/16 238 lb (108 kg)   Temp Readings from Last 3 Encounters:  07/25/16 98.4 F (36.9 C) (Oral)  02/03/15 98 F (36.7 C) (Oral)   BP Readings from  Last 3 Encounters:  08/15/16 120/72  07/26/16 122/84  07/25/16 115/73   Pulse Readings from Last 3 Encounters:  08/15/16 62  07/26/16 72  07/25/16 86   Pain Assessment Pain Score: 1  (Dysuria)/10  In general this is a well appearing African-American male in no acute distress. He's alert and oriented x4 and appropriate throughout the examination. Cardiopulmonary assessment is negative for acute distress, and he exhibits normal effort.    KPS = 100  100 - Normal; no complaints; no evidence of disease. 90   - Able to carry on normal activity; minor signs or symptoms of disease. 80   - Normal activity with effort; some signs or symptoms of disease. 73   - Cares for self; unable to carry on normal activity or to do active work. 60   - Requires occasional assistance, but is able to care for most of his personal needs. 50   - Requires considerable assistance and frequent medical care. 50   - Disabled; requires special care and assistance. 93   - Severely disabled; hospital admission is indicated although death not imminent. 8   - Very sick; hospital admission necessary; active supportive treatment necessary. 10   - Moribund; fatal processes progressing rapidly. 0     - Dead  Karnofsky DA, Abelmann [place], Craver LS and Burchenal [facility] [phone]) The use of the nitrogen mustards in the palliative treatment of carcinoma: with particular reference to bronchogenic carcinoma Cancer 1 634-56  LABORATORY DATA:  Lab Results  Component Value Date   WBC 7.6 07/25/2016   HGB 15.5 07/25/2016   HCT 46.4 07/25/2016   MCV 83.5 07/25/2016   PLT 207 07/25/2016   Lab Results  Component Value Date   NA 139 07/25/2016   K 4.2 07/25/2016   CL 104 07/25/2016   CO2 25 07/25/2016   No results found for: "ALT", "AST", "GGT", "ALKPHOS", "BILITOT"   RADIOGRAPHY: No results found.    IMPRESSION/PLAN: 1. 66 y.o. gentleman with Stage T1c adenocarcinoma of the prostate with Gleason Score of 3+4, and PSA of  4.67. We reviewed the nature of favorable intermediate risk prostate cancer. We reviewed the rationale for LDR brachytherapy, and focused on the details and logistics of delivery. We discussed and outlined the risks, benefits, short and long-term effects associated with brachytherapy as well as the role of SpaceOAR gel in reducing the rectal toxicity associated with radiotherapy. He appears to have a good understanding of his disease and our treatment recommendations which are of curative intent.  He was encouraged to ask questions that were answered to his stated satisfaction.  He has freely signed written consent to proceed today in the office and a copy of this document will be placed in his medical record.  The patient was  also provided with a copy of the consent.  He has undergone his CT simulation for treatment planning, prior to our visit today is scheduled for LDR brachytherapy and SpaceOAR gel placement on 02/02/2022, coordinated with Dr. Diona Fanti.  I enjoyed meeting with him again today and look forward to continuing to participate in his care.  I personally spent 30 minutes in this encounter including chart review, reviewing radiological studies, meeting face-to-face with the patient, entering orders and completing documentation.    Nicholos Johns, PA-C    Tyler Pita, MD  [facility] Direct Dial: [phone]  Fax: [phone] [email or web address]  Skype  LinkedIn

## 2021-12-15 NOTE — Progress Notes (Signed)
Pre-seed appointment. I verified patient's identity and began nursing interview w/ spouse Mrs. Rockie Neighbours in attendance. Patient reports mild dysuria 1/10. No other issues reported at this time.  Meaningful use complete. No urinary management medications. Urology appt- August, 2023  Resp 19   Ht 6' (1.829 m)   Wt 229 lb (103.9 kg)   BMI 31.06 kg/m

## 2021-12-15 NOTE — Progress Notes (Incomplete)
  Radiation Oncology         [phone] ________________________________  Name: Edwin [name] MRN: [id number]  Date: 12/15/2021  DOB: Nov 03, 1955  SIMULATION AND TREATMENT PLANNING NOTE PUBIC ARCH STUDY  [id number], [facility] @ [facility], Annie Main, MD  DIAGNOSIS:  66 y.o. gentleman with Stage T1c adenocarcinoma of the prostate with Gleason score of 3+4, and PSA of 4.67.  Oncology History  Malignant neoplasm of prostate ([place])  09/02/2021 Cancer Staging   Staging form: Prostate, AJCC 8th Edition - Clinical stage from 09/02/2021: Stage IIB (cT1c, cN0, cM0, PSA: 4.7, Grade Group: 2) - Signed by Freeman Caldron, PA-C on 11/09/2021 Histopathologic type: Adenocarcinoma, NOS Stage prefix: Initial diagnosis Prostate specific antigen (PSA) range: Less than 10 Gleason primary pattern: 3 Gleason secondary pattern: 4 Gleason score: 7 Histologic grading system: 5 grade system Number of biopsy cores examined: 12 Number of biopsy cores positive: 3 Location of positive needle core biopsies: Both sides   11/09/2021 Initial Diagnosis   Malignant neoplasm of prostate ([place])     No diagnosis found.  COMPLEX SIMULATION:  The patient presented today for evaluation for possible prostate seed implant. He was brought to the radiation planning suite and placed supine on the CT couch. A 3-dimensional image study set was obtained in upload to the planning computer. There, on each axial slice, I contoured the prostate gland. Then, using three-dimensional radiation planning tools I reconstructed the prostate in view of the structures from the transperineal needle pathway to assess for possible pubic arch interference. In doing so, I did not appreciate any pubic arch interference. Also, the patient's prostate volume was estimated based on the drawn structure. The volume was *** cc.  Given the pubic arch appearance and prostate volume, patient remains a good candidate to proceed with prostate  seed implant. Today, he freely provided informed written consent to proceed.    PLAN: The patient will undergo prostate seed implant.   ________________________________  Sheral Apley. Tammi Klippel, M.D.

## 2021-12-24 IMAGING — US US ABDOMINAL AORTA SCREENING AAA
1 series · 8 of 8 positions shown · non-contrast
Comparison: CT pelvis images 10/15/2003.

CLINICAL DATA: AAA screen.

EXAM:
US ABDOMINAL AORTA MEDICARE SCREENING
TECHNIQUE: Ultrasound examination of the abdominal aorta was performed as a
screening evaluation for abdominal aortic aneurysm.

[Series 1: us abdominal aorta screening aaa · 0.26mm/px · 8 of 8 slices shown]
[im 1/8]
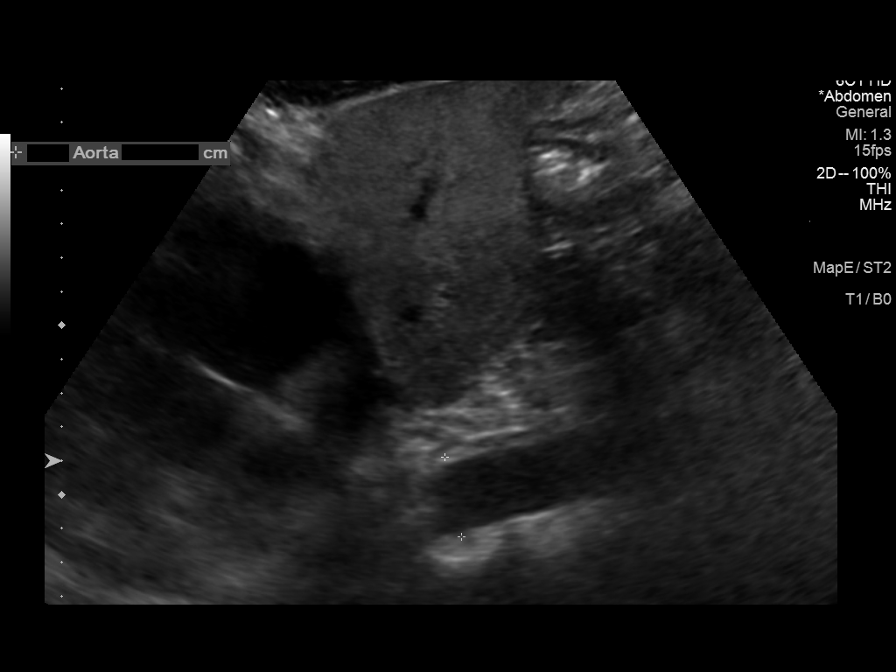
[im 2/8]
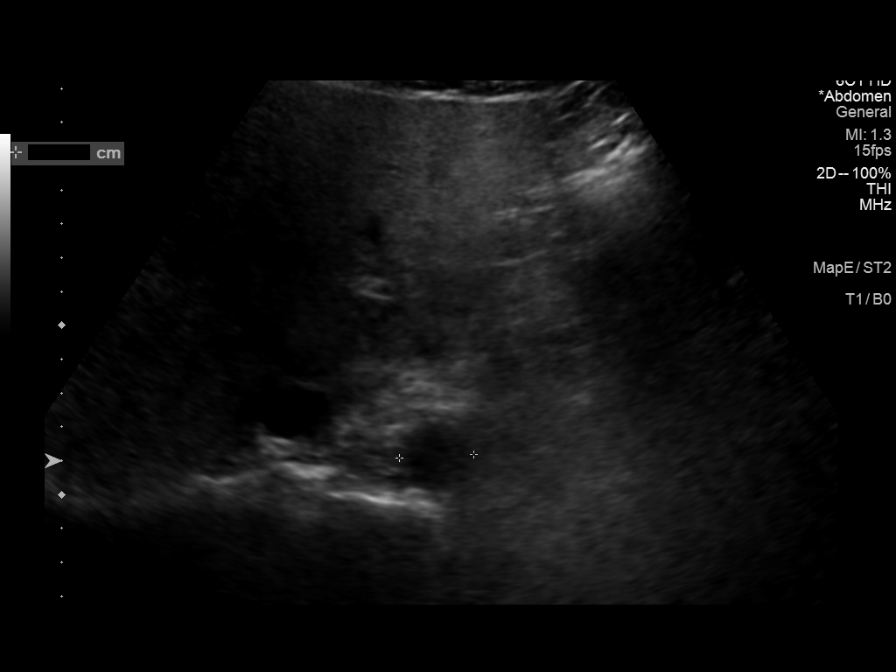
[im 3/8]
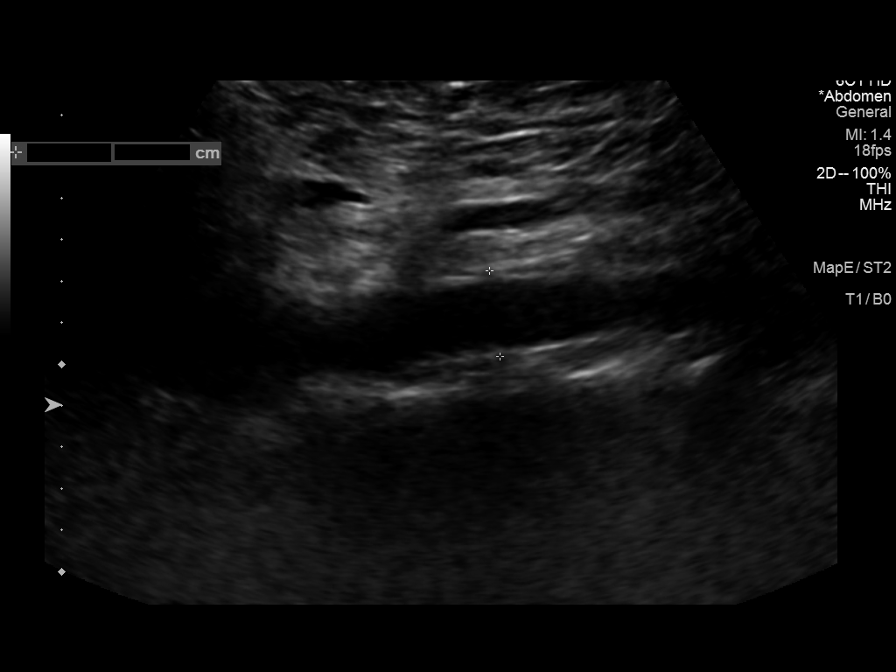
[im 4/8]
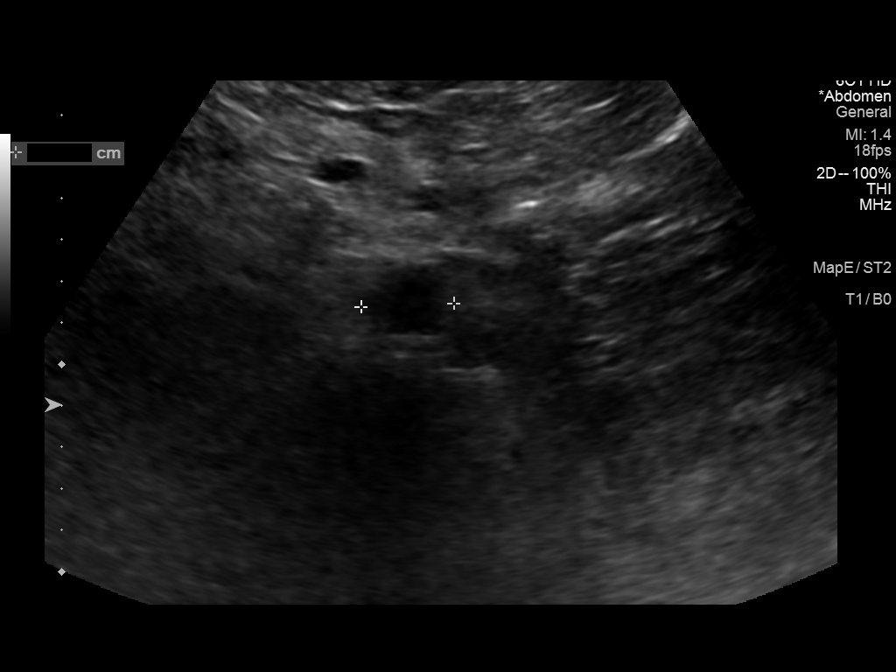
[im 5/8]
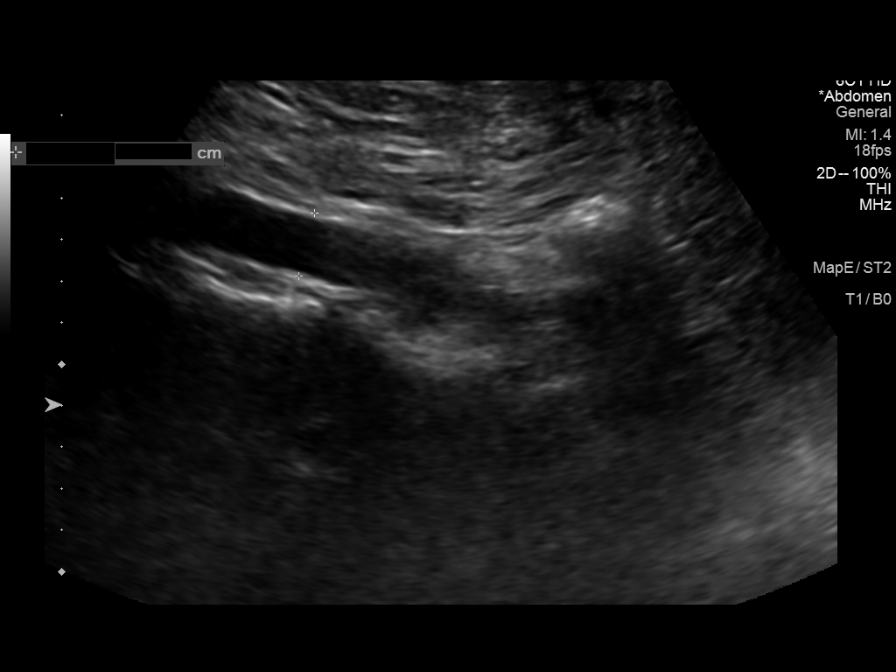
[im 6/8]
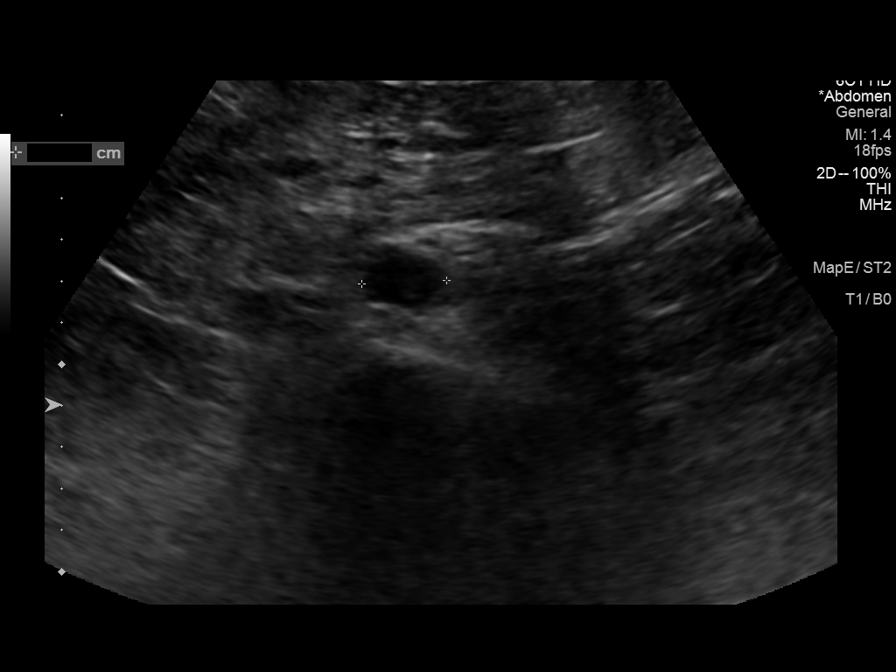
[im 7/8]
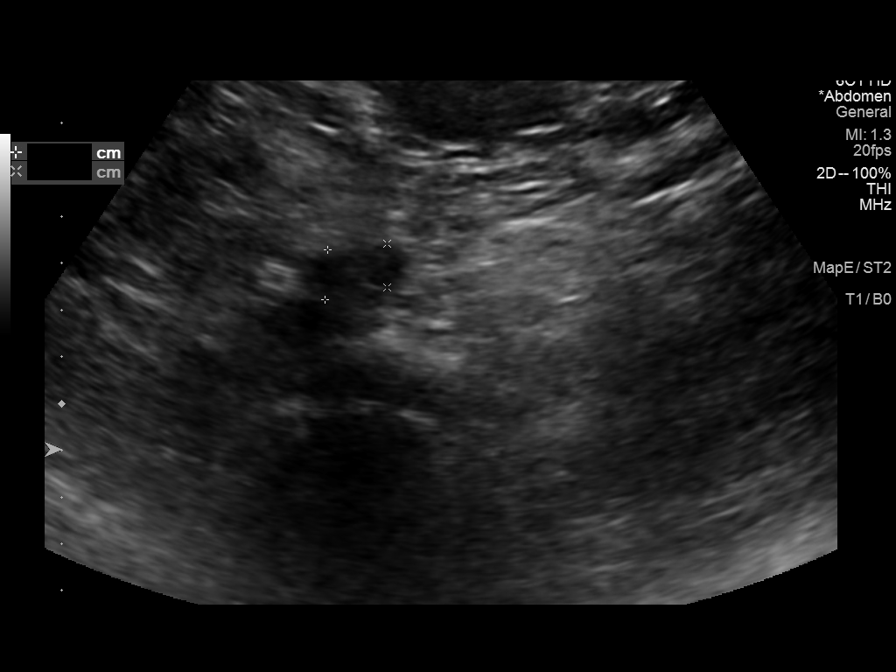
[im 8/8]
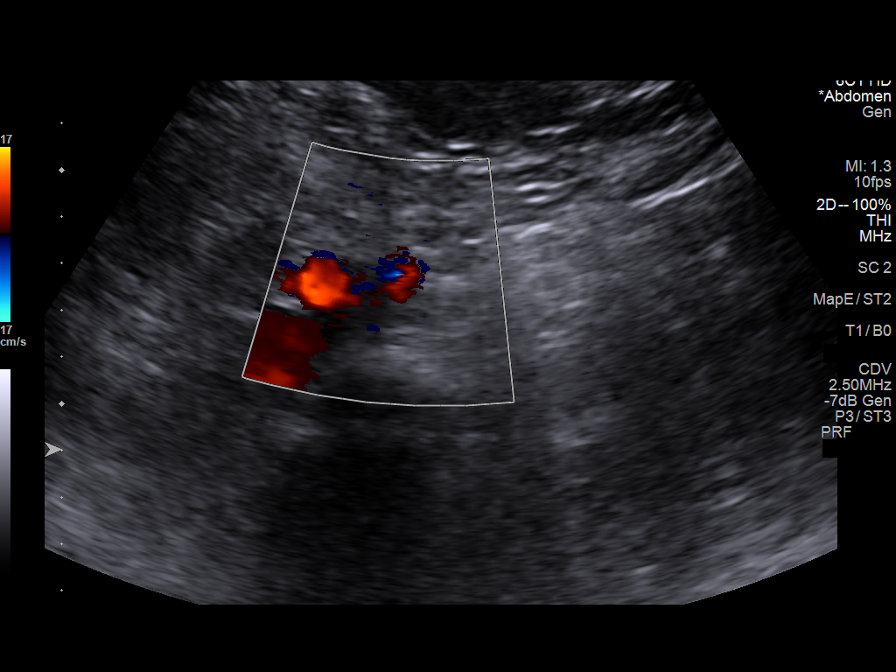

[8 of 8 positions shown; findings below may reference images not displayed]

FINDINGS: Abdominal aortic measurements as follows:

Proximal:  2.4 cm

Mid:  2.2 cm

Distal:  2.1 cm
IMPRESSION: Normal exam.  No evidence of abdominal aortic aneurysm.

## 2022-01-27 ENCOUNTER — Encounter (HOSPITAL_BASED_OUTPATIENT_CLINIC_OR_DEPARTMENT_OTHER): Payer: Self-pay | Admitting: Urology

## 2022-01-27 NOTE — Progress Notes (Signed)
Spoke w/ via phone for pre-op interview--- Edwin [name] needs dos----    NONE           [name] results------ Current EKG in Epic dated 11/2021 COVID test -----patient states asymptomatic no test needed Arrive at -------0845 NPO after MN NO Solid Food.   Med rec completed Medications to take morning of surgery -----Nexium Diabetic medication ----- Patient instructed no nail polish to be worn day of surgery Patient instructed to bring photo id and insurance card day of surgery Patient aware to have Driver (ride ) / caregiver  [name]  for 24 hours after surgery  Patient Special Instructions ----- Pre-Op special Istructions ----- Verbalized understanding of using Fleets enema AM of surgery. Patient verbalized understanding of instructions that were given at this phone interview. Patient denies shortness of breath, chest pain, fever, cough at this phone interview.

## 2022-02-03 ENCOUNTER — Telehealth: Payer: Self-pay | Admitting: *Deleted

## 2022-02-03 NOTE — Telephone Encounter (Signed)
CALLED PATIENT TO REMIND OF PROCEDURE FOR 02-06-22, SPOKE WITH PATIENT AND HE IS AWARE OF THIS PROCEDURE

## 2022-02-05 NOTE — Anesthesia Preprocedure Evaluation (Addendum)
Anesthesia Evaluation  Patient identified by MRN, date of birth, ID band Patient awake    Reviewed: Allergy & Precautions, H&P , NPO status , Patient's Chart, lab work & pertinent test results  Airway Mallampati: II  TM Distance: >3 FB Neck ROM: Full    Dental no notable dental hx. (+) Teeth Intact, Dental Advisory Given, Caps   Pulmonary neg pulmonary ROS, former smoker,    Pulmonary exam normal breath sounds clear to auscultation       Cardiovascular Exercise Tolerance: Good negative cardio ROS Normal cardiovascular exam(-) dysrhythmias  Rhythm:Regular Rate:Normal     Neuro/Psych negative neurological ROS  negative psych ROS   GI/Hepatic negative GI ROS, Neg liver ROS, GERD  Medicated,  Endo/Other  negative endocrine ROS  Renal/GU negative Renal ROS  negative genitourinary   Musculoskeletal negative musculoskeletal ROS (+) Arthritis , Osteoarthritis,    Abdominal   Peds negative pediatric ROS (+)  Hematology negative hematology ROS (+)   Anesthesia Other Findings   Reproductive/Obstetrics negative OB ROS                            Anesthesia Physical Anesthesia Plan  ASA: 2  Anesthesia Plan: General   Post-op Pain Management: Tylenol PO (pre-op)* and Celebrex PO (pre-op)*   Induction: Intravenous  PONV Risk Score and Plan: 2 and Ondansetron, Dexamethasone and Treatment may vary due to age or medical condition  Airway Management Planned: Oral ETT and LMA  Additional Equipment: None  Intra-op Plan:   Post-operative Plan: Extubation in OR  Informed Consent: I have reviewed the patients History and Physical, chart, labs and discussed the procedure including the risks, benefits and alternatives for the proposed anesthesia with the patient or authorized representative who has indicated his/her understanding and acceptance.       Plan Discussed with: Anesthesiologist and  CRNA  Anesthesia Plan Comments:        Anesthesia Quick Evaluation

## 2022-02-06 ENCOUNTER — Ambulatory Visit (HOSPITAL_BASED_OUTPATIENT_CLINIC_OR_DEPARTMENT_OTHER)
Admission: RE | Admit: 2022-02-06 | Discharge: 2022-02-06 | Disposition: A | Discharge status: Home / Self Care | Payer: Medicare Other | Attending: Urology | Admitting: Urology

## 2022-02-06 ENCOUNTER — Encounter (HOSPITAL_BASED_OUTPATIENT_CLINIC_OR_DEPARTMENT_OTHER)
Admission: RE | Disposition: A | Discharge status: Home / Self Care | Payer: Self-pay | Source: Home / Self Care | Attending: Urology

## 2022-02-06 ENCOUNTER — Ambulatory Visit (HOSPITAL_BASED_OUTPATIENT_CLINIC_OR_DEPARTMENT_OTHER): Payer: Medicare Other | Admitting: Anesthesiology

## 2022-02-06 ENCOUNTER — Ambulatory Visit (HOSPITAL_COMMUNITY): Payer: Medicare Other

## 2022-02-06 ENCOUNTER — Other Ambulatory Visit: Payer: Self-pay

## 2022-02-06 ENCOUNTER — Encounter (HOSPITAL_BASED_OUTPATIENT_CLINIC_OR_DEPARTMENT_OTHER): Payer: Self-pay | Admitting: Urology

## 2022-02-06 DIAGNOSIS — K219 Gastro-esophageal reflux disease without esophagitis: Secondary | ICD-10-CM | POA: Diagnosis not present

## 2022-02-06 DIAGNOSIS — C61 Malignant neoplasm of prostate: Secondary | ICD-10-CM

## 2022-02-06 DIAGNOSIS — Z87891 Personal history of nicotine dependence: Secondary | ICD-10-CM | POA: Diagnosis not present

## 2022-02-06 DIAGNOSIS — Z01818 Encounter for other preprocedural examination: Secondary | ICD-10-CM

## 2022-02-06 HISTORY — PX: RADIOACTIVE SEED IMPLANT: SHX5150

## 2022-02-06 HISTORY — DX: Unspecified osteoarthritis, unspecified site: M19.90

## 2022-02-06 HISTORY — DX: Gastro-esophageal reflux disease without esophagitis: K21.9

## 2022-02-06 HISTORY — PX: SPACE OAR INSTILLATION: SHX6769

## 2022-02-06 SURGERY — INSERTION, RADIATION SOURCE, PROSTATE
Anesthesia: General | Site: Prostate

## 2022-02-06 MED ORDER — EPHEDRINE 5 MG/ML INJ
INTRAVENOUS | Status: AC
  Filled 2022-02-06: qty 10

## 2022-02-06 MED ORDER — CEFAZOLIN SODIUM-DEXTROSE 2-4 GM/100ML-% IV SOLN
INTRAVENOUS | Status: AC
  Filled 2022-02-06: qty 100

## 2022-02-06 MED ORDER — OXYCODONE HCL 5 MG/5ML PO SOLN: 5.0000 mg | Freq: Once | ORAL | Status: DC | PRN

## 2022-02-06 MED ORDER — MIDAZOLAM HCL 5 MG/5ML IJ SOLN
INTRAMUSCULAR | Status: DC | PRN
  Administered 2022-02-06: 2 mg via INTRAVENOUS

## 2022-02-06 MED ORDER — IOHEXOL 300 MG/ML  SOLN
INTRAMUSCULAR | Status: DC | PRN
  Administered 2022-02-06: 7 mL

## 2022-02-06 MED ORDER — CHLORHEXIDINE GLUCONATE 0.12 % MT SOLN: 15.0000 mL | Freq: Once | OROMUCOSAL | Status: DC

## 2022-02-06 MED ORDER — OXYCODONE HCL 5 MG PO TABS: 5.0000 mg | ORAL_TABLET | Freq: Once | ORAL | Status: DC | PRN

## 2022-02-06 MED ORDER — SODIUM CHLORIDE (PF) 0.9 % IJ SOLN
INTRAMUSCULAR | Status: DC | PRN
  Administered 2022-02-06: 3 mL

## 2022-02-06 MED ORDER — DEXAMETHASONE SODIUM PHOSPHATE 10 MG/ML IJ SOLN
INTRAMUSCULAR | Status: DC | PRN
  Administered 2022-02-06: 10 mg via INTRAVENOUS

## 2022-02-06 MED ORDER — FENTANYL CITRATE (PF) 100 MCG/2ML IJ SOLN
INTRAMUSCULAR | Status: DC | PRN
  Administered 2022-02-06: 100 ug via INTRAVENOUS

## 2022-02-06 MED ORDER — FENTANYL CITRATE (PF) 100 MCG/2ML IJ SOLN: 25.0000 ug | INTRAMUSCULAR | Status: DC | PRN

## 2022-02-06 MED ORDER — ORAL CARE MOUTH RINSE: 15.0000 mL | Freq: Once | OROMUCOSAL | Status: DC

## 2022-02-06 MED ORDER — ROCURONIUM BROMIDE 10 MG/ML (PF) SYRINGE
PREFILLED_SYRINGE | INTRAVENOUS | Status: AC
  Filled 2022-02-06: qty 10

## 2022-02-06 MED ORDER — PROPOFOL 10 MG/ML IV BOLUS
INTRAVENOUS | Status: DC | PRN
  Administered 2022-02-06: 150 mg via INTRAVENOUS

## 2022-02-06 MED ORDER — ARTIFICIAL TEARS OPHTHALMIC OINT
TOPICAL_OINTMENT | OPHTHALMIC | Status: AC
  Filled 2022-02-06: qty 3.5

## 2022-02-06 MED ORDER — ONDANSETRON HCL 4 MG/2ML IJ SOLN
INTRAMUSCULAR | Status: AC
  Filled 2022-02-06: qty 2

## 2022-02-06 MED ORDER — PROPOFOL 10 MG/ML IV BOLUS
INTRAVENOUS | Status: AC
  Filled 2022-02-06: qty 20

## 2022-02-06 MED ORDER — ROCURONIUM BROMIDE 10 MG/ML (PF) SYRINGE
PREFILLED_SYRINGE | INTRAVENOUS | Status: DC | PRN
  Administered 2022-02-06: 70 mg via INTRAVENOUS

## 2022-02-06 MED ORDER — ONDANSETRON HCL 4 MG/2ML IJ SOLN
INTRAMUSCULAR | Status: DC | PRN
  Administered 2022-02-06: 4 mg via INTRAVENOUS

## 2022-02-06 MED ORDER — STERILE WATER FOR IRRIGATION IR SOLN
Status: DC | PRN
  Administered 2022-02-06: 500 mL

## 2022-02-06 MED ORDER — CEFAZOLIN SODIUM-DEXTROSE 2-4 GM/100ML-% IV SOLN
2.0000 g | Freq: Once | INTRAVENOUS | Status: AC
  Administered 2022-02-06: 2 g via INTRAVENOUS

## 2022-02-06 MED ORDER — MEPERIDINE HCL 25 MG/ML IJ SOLN: 6.2500 mg | INTRAMUSCULAR | Status: DC | PRN

## 2022-02-06 MED ORDER — SODIUM CHLORIDE 0.9 % IV SOLN
INTRAVENOUS | Status: AC | PRN
  Administered 2022-02-06: 1000 mL

## 2022-02-06 MED ORDER — ONDANSETRON HCL 4 MG/2ML IJ SOLN
4.0000 mg | Freq: Once | INTRAMUSCULAR | Status: DC | PRN
Start: 2022-02-06 — End: 2022-02-06

## 2022-02-06 MED ORDER — EPHEDRINE SULFATE (PRESSORS) 50 MG/ML IJ SOLN
INTRAMUSCULAR | Status: DC | PRN
  Administered 2022-02-06: 10 mg via INTRAVENOUS

## 2022-02-06 MED ORDER — LIDOCAINE 2% (20 MG/ML) 5 ML SYRINGE
INTRAMUSCULAR | Status: DC | PRN
  Administered 2022-02-06: 100 mg via INTRAVENOUS

## 2022-02-06 MED ORDER — SUGAMMADEX SODIUM 200 MG/2ML IV SOLN
INTRAVENOUS | Status: DC | PRN
  Administered 2022-02-06: 200 mg via INTRAVENOUS

## 2022-02-06 MED ORDER — ACETAMINOPHEN 160 MG/5ML PO SOLN: 325.0000 mg | ORAL | Status: DC | PRN

## 2022-02-06 MED ORDER — LACTATED RINGERS IV SOLN: INTRAVENOUS | Status: DC

## 2022-02-06 MED ORDER — FLEET ENEMA 7-19 GM/118ML RE ENEM: 1.0000 | ENEMA | Freq: Once | RECTAL | Status: DC

## 2022-02-06 MED ORDER — ACETAMINOPHEN 325 MG PO TABS: 325.0000 mg | ORAL_TABLET | ORAL | Status: DC | PRN

## 2022-02-06 MED ORDER — FENTANYL CITRATE (PF) 100 MCG/2ML IJ SOLN
INTRAMUSCULAR | Status: AC
  Filled 2022-02-06: qty 2

## 2022-02-06 MED ORDER — DEXAMETHASONE SODIUM PHOSPHATE 10 MG/ML IJ SOLN
INTRAMUSCULAR | Status: AC
  Filled 2022-02-06: qty 1

## 2022-02-06 SURGICAL SUPPLY — 52 items
BAG DRN RND TRDRP ANRFLXCHMBR (UROLOGICAL SUPPLIES) ×2
BAG URINE DRAIN 2000ML AR STRL (UROLOGICAL SUPPLIES) ×3 IMPLANT
BLADE CLIPPER SENSICLIP SURGIC (BLADE) ×3 IMPLANT
CATH FOLEY 2WAY SLVR  5CC 16FR (CATHETERS) ×3
CATH FOLEY 2WAY SLVR 5CC 16FR (CATHETERS) ×2 IMPLANT
CATH ROBINSON RED A/P 16FR (CATHETERS) IMPLANT
CATH ROBINSON RED A/P 20FR (CATHETERS) ×3 IMPLANT
CLOTH BEACON ORANGE TIMEOUT ST (SAFETY) ×3 IMPLANT
CNTNR URN SCR LID CUP LEK RST (MISCELLANEOUS) ×2 IMPLANT
CONT SPEC 4OZ STRL OR WHT (MISCELLANEOUS) ×3
COVER BACK TABLE 60X90IN (DRAPES) ×3 IMPLANT
COVER MAYO STAND STRL (DRAPES) ×3 IMPLANT
DRSG TEGADERM 4X4.75 (GAUZE/BANDAGES/DRESSINGS) ×2 IMPLANT
DRSG TEGADERM 8X12 (GAUZE/BANDAGES/DRESSINGS) ×3 IMPLANT
GAUZE SPONGES 4X4 ×1 IMPLANT
GEL ULTRASOUND 20GR AQUASONIC (MISCELLANEOUS) ×3 IMPLANT
GEL ULTRASOUND 8.5O AQUASONIC (MISCELLANEOUS) ×3 IMPLANT
GLOVE BIO SURGEON STRL SZ 6.5 (GLOVE) ×4 IMPLANT
GLOVE BIO SURGEON STRL SZ7.5 (GLOVE) IMPLANT
GLOVE BIO SURGEON STRL SZ8 (GLOVE) ×6 IMPLANT
GLOVE BIOGEL PI IND STRL 6.5 (GLOVE) IMPLANT
GLOVE BIOGEL PI IND STRL 7.0 (GLOVE) IMPLANT
GLOVE BIOGEL PI INDICATOR 6.5 (GLOVE)
GLOVE BIOGEL PI INDICATOR 7.0 (GLOVE) ×1
GLOVE SURG ORTHO 8.5 STRL (GLOVE) ×3 IMPLANT
GLOVE SURG SS PI 6.5 STRL IVOR (GLOVE) IMPLANT
GOWN STRL REUS W/TWL LRG LVL3 (GOWN DISPOSABLE) ×3 IMPLANT
GOWN STRL REUS W/TWL XL LVL3 (GOWN DISPOSABLE) ×3 IMPLANT
GRID BRACH TEMP 18GA 2.8X3X.75 (MISCELLANEOUS) ×3 IMPLANT
HOLDER FOLEY CATH W/STRAP (MISCELLANEOUS) ×3 IMPLANT
I-125 Implant Seed ×62 IMPLANT
IMPL SPACEOAR VUE SYSTEM (Spacer) ×2 IMPLANT
IMPLANT SPACEOAR VUE SYSTEM (Spacer) ×3 IMPLANT
IV NS 1000ML (IV SOLUTION) ×3
IV NS 1000ML BAXH (IV SOLUTION) ×2 IMPLANT
KIT TURNOVER CYSTO (KITS) ×3 IMPLANT
NDL BRACHY 18G 5PK (NEEDLE) ×8 IMPLANT
NDL BRACHY 18G SINGLE (NEEDLE) IMPLANT
NDL PK MORGANSTERN STABILIZ (NEEDLE) ×2 IMPLANT
NEEDLE BRACHY 18G 5PK (NEEDLE) ×12 IMPLANT
NEEDLE BRACHY 18G SINGLE (NEEDLE) IMPLANT
NEEDLE PK MORGANSTERN STABILIZ (NEEDLE) ×3 IMPLANT
PACK CYSTO (CUSTOM PROCEDURE TRAY) ×3 IMPLANT
SHEATH ULTRASOUND LF (SHEATH) IMPLANT
SHEATH ULTRASOUND LTX NONSTRL (SHEATH) IMPLANT
SUT BONE WAX W31G (SUTURE) IMPLANT
SYR 10ML LL (SYRINGE) ×3 IMPLANT
SYR CONTROL 10ML LL (SYRINGE) ×3 IMPLANT
TEGADERM IMPLANT
TOWEL OR 17X26 10 PK STRL BLUE (TOWEL DISPOSABLE) ×3 IMPLANT
UNDERPAD 30X36 HEAVY ABSORB (UNDERPADS AND DIAPERS) ×6 IMPLANT
WATER STERILE IRR 500ML POUR (IV SOLUTION) ×3 IMPLANT

## 2022-02-06 NOTE — Anesthesia Postprocedure Evaluation (Signed)
Anesthesia Post Note  Patient: Edwin [name]  Procedure(s) Performed: RADIOACTIVE SEED IMPLANT/BRACHYTHERAPY IMPLANT (Prostate) SPACE OAR INSTILLATION (Perineum)     Patient location during evaluation: PACU Anesthesia Type: General Level of consciousness: awake and alert Pain management: pain level controlled Vital Signs Assessment: post-procedure vital signs reviewed and stable Respiratory status: spontaneous breathing, nonlabored ventilation, respiratory function stable and patient connected to nasal cannula oxygen Cardiovascular status: blood pressure returned to baseline and stable Postop Assessment: no apparent nausea or vomiting Anesthetic complications: no   No notable events documented.  Last Vitals:  Vitals:   02/06/22 1255 02/06/22 1333  BP:  130/77  Pulse: 69 67  Resp: 20 16  Temp:  (!) 36.1 C  SpO2: 98% 98%    Last Pain:  Vitals:   02/06/22 1333  TempSrc: Oral  PainSc: 0-No pain                 Vela Render

## 2022-02-06 NOTE — Anesthesia Procedure Notes (Signed)
Procedure Name: Intubation Date/Time: 02/06/2022 11:05 AM  Performed by: Rogers Blocker, CRNAPre-anesthesia Checklist: Patient identified, Emergency Drugs available, Suction available and Patient being monitored Patient Re-evaluated:Patient Re-evaluated prior to induction Oxygen Delivery Method: Circle System Utilized Preoxygenation: Pre-oxygenation with 100% oxygen Induction Type: IV induction Ventilation: Mask ventilation without difficulty Laryngoscope Size: Mac and 4 Grade View: Grade I Tube type: Oral Tube size: 7.0 mm Number of attempts: 1 Airway Equipment and Method: Stylet and Oral airway Placement Confirmation: ETT inserted through vocal cords under direct vision, positive ETCO2 and breath sounds checked- equal and bilateral Secured at: 22 cm Tube secured with: Tape Dental Injury: Teeth and Oropharynx as per pre-operative assessment

## 2022-02-06 NOTE — H&P (Signed)
H&P  Chief Complaint: Prostate cancer  History of Present Illness: 66 year old male here today for I-125 brachytherapy with placement of SpaceOAR.  His history as follows:  He underwent ultrasound and biopsy of his prostate on 09/02/2021. At that time, PSA was 4.7, prostate volume 24 mL, PSA density 0.20.  3/10 cores revealed adenocarcinoma:  Left apex lateral-GS 3+3 pattern and 15% of core  Right apex lateral, GS 3+3 in 2% of core  Right mid lateral, GS 3+4 in 5% of core    Past Medical History:  Diagnosis Date   Arthritis    GERD (gastroesophageal reflux disease)    Hypercholesteremia     Past Surgical History:  Procedure Laterality Date   FOOT SURGERY Right    66 y/o   HERNIA REPAIR Right    1980s, inguinal   KNEE SURGERY Right 2006   Meniscus repair    Home Medications:    Allergies:  Allergies  Allergen Reactions   Percocet [Oxycodone-Acetaminophen] Swelling    Plain tylenol ok    History reviewed. No pertinent family history.  Social History:  reports that he quit smoking about 22 years ago. His smoking use included cigarettes. He has never used smokeless tobacco. He reports current alcohol use. He reports that he does not use drugs.  ROS: A complete review of systems was performed.  All systems are negative except for pertinent findings as noted.  Physical Exam:  Vital signs in last 24 hours: BP 118/72   Pulse 69   Temp 98.3 F (36.8 C) (Oral)   Resp 17   Ht 6' (1.829 m)   Wt 101 kg   SpO2 99%   BMI 30.19 kg/m  Constitutional:  Alert and oriented, No acute distress Cardiovascular: Regular rate  Respiratory: Normal respiratory effort Neurologic: Grossly intact, no focal deficits Psychiatric: Normal mood and affect  I have reviewed prior pt notes   I have reviewed prior PSA results    Impression/Assessment:  Grade group 2 prostate cancer  Plan:  I-125 brachytherapy, placement of SpaceOAR

## 2022-02-06 NOTE — Progress Notes (Signed)
  Radiation Oncology         ([phone]) [phone] ________________________________  Name: Edwin [name] MRN: [id number]  Date: 02/06/2022  DOB: Oct 25, 1955       Prostate Seed Implant  YF:VCBSWHQ, [facility] @ [place]  No ref. provider found  DIAGNOSIS:  66 y.o. gentleman with Stage T1c adenocarcinoma of the prostate with Gleason score of 3+4, and PSA of 4.67.  Oncology History  Malignant neoplasm of prostate ([place])  09/02/2021 Cancer Staging   Staging form: Prostate, AJCC 8th Edition - Clinical stage from 09/02/2021: Stage IIB (cT1c, cN0, cM0, PSA: 4.7, Grade Group: 2) - Signed by Freeman Caldron, PA-C on 11/09/2021 Histopathologic type: Adenocarcinoma, NOS Stage prefix: Initial diagnosis Prostate specific antigen (PSA) range: Less than 10 Gleason primary pattern: 3 Gleason secondary pattern: 4 Gleason score: 7 Histologic grading system: 5 grade system Number of biopsy cores examined: 12 Number of biopsy cores positive: 3 Location of positive needle core biopsies: Both sides   11/09/2021 Initial Diagnosis   Malignant neoplasm of prostate ([place])       ICD-10-CM   1. Pre-op testing  Z01.818 CBG per Guidelines for Diabetes Management for Patients Undergoing Surgery ([facility], [facility], and [facility] only)    CBG per protocol    CBG per Guidelines for Diabetes Management for Patients Undergoing Surgery ([facility], [facility], and [facility] only)    CBG per protocol      PROCEDURE: Insertion of radioactive I-125 seeds into the prostate gland.  RADIATION DOSE: 145 Gy, definitive therapy.  TECHNIQUE: Edwin [name] was brought to the operating room with the urologist. He was placed in the dorsolithotomy position. He was catheterized and a rectal tube was inserted. The perineum was shaved, prepped and draped. The ultrasound probe was then introduced into the rectum to see the prostate gland.  TREATMENT DEVICE: A needle grid was attached to the ultrasound probe stand and anchor needles were placed.  3D PLANNING: The  prostate was imaged in 3D using a sagittal sweep of the prostate probe. These images were transferred to the planning computer. There, the prostate, urethra and rectum were defined on each axial reconstructed image. Then, the software created an optimized 3D plan and a few seed positions were adjusted. The quality of the plan was reviewed using [facility] information for the target and the following two organs at risk:  Urethra and Rectum.  Then the accepted plan was printed and handed off to the radiation therapist.  Under my supervision, the custom loading of the seeds and spacers was carried out and loaded into sealed vicryl sleeves.  These pre-loaded needles were then placed into the needle holder.Marland Kitchen  PROSTATE VOLUME STUDY:  Using transrectal ultrasound the volume of the prostate was verified to be 24 cc.  SPECIAL TREATMENT PROCEDURE/SUPERVISION AND HANDLING: The pre-loaded needles were then delivered under sagittal guidance. A total of 17 needles were used to deposit 62 seeds in the prostate gland. The individual seed activity was 0.324 mCi.  SpaceOAR:  Yes  COMPLEX SIMULATION: At the end of the procedure, an anterior radiograph of the pelvis was obtained to document seed positioning and count. Cystoscopy was performed to check the urethra and bladder.  MICRODOSIMETRY: At the end of the procedure, the patient was emitting 0.04 mR/hr at 1 meter. Accordingly, he was considered safe for hospital discharge.  PLAN: The patient will return to the radiation oncology clinic for post implant CT dosimetry in three weeks.   ________________________________  Sheral Apley Tammi Klippel, M.D.

## 2022-02-06 NOTE — Discharge Instructions (Addendum)
.  Radioactive Seed Implant Home Care Instructions   Activity:    Rest for the remainder of the day.  Do not drive or operate equipment today.  You may resume normal  activities in a few days as instructed by your physician, without risk of harmful radiation exposure to those around you, provided you follow the time and distance precautions on the Radiation Oncology Instruction Sheet.   Meals: Drink plenty of lipuids and eat light foods, such as gelatin or soup this evening .  You may return to normal meal plan tomorrow.  Return To Work: You may return to work as instructed by [employer].  Special Instruction:   If any seeds are found, use tweezers to pick up seeds and place in a glass container of any kind and bring to your physician's office.  Call your physician if any of these symptoms occur:  Persistent or heavy bleeding Urine stream diminishes or stops completely after catheter is removed Fever equal to or greater than 101 degrees F Cloudy urine with a strong foul odor Severe pain  You may feel some burning pain and/or hesitancy when you urinate after the catheter is removed.  These symptoms may increase over the next few weeks, but should diminish within forur to six weeks.  Applying moist heat to the lower abdomen or a hot tub bath may help relieve the pain.  If the discomfort becomes severe, please call your physician for additional medications.   Post Anesthesia Home Care Instructions  Activity: Get plenty of rest for the remainder of the day. A responsible adult should stay with you for 24 hours following the procedure.  For the next 24 hours, DO NOT: -Drive a car -Paediatric nurse -Drink alcoholic beverages -Take any medication unless instructed by your physician -Make any legal decisions or sign important papers.  Meals: Start with liquid foods such as gelatin or soup. Progress to regular foods as tolerated. Avoid greasy, spicy, heavy foods. If nausea and/or  vomiting occur, drink only clear liquids until the nausea and/or vomiting subsides. Call your physician if vomiting continues.  Special Instructions/Symptoms: Your throat may feel dry or sore from the anesthesia or the breathing tube placed in your throat during surgery. If this causes discomfort, gargle with warm salt water. The discomfort should disappear within 24 hours.

## 2022-02-06 NOTE — Interval H&P Note (Signed)
History and Physical Interval Note:  02/06/2022 10:20 AM  Edwin [name]  has presented today for surgery, with the diagnosis of PROSTATE CANCER.  The various methods of treatment have been discussed with the patient and family. After consideration of risks, benefits and other options for treatment, the patient has consented to  Procedure(s): RADIOACTIVE SEED IMPLANT/BRACHYTHERAPY IMPLANT (N/A) SPACE OAR INSTILLATION (N/A) as a surgical intervention.  The patient's history has been reviewed, patient examined, no change in status, stable for surgery.  I have reviewed the patient's chart and labs.  Questions were answered to the patient's satisfaction.     Lillette Boxer Debborah Alonge

## 2022-02-06 NOTE — Transfer of Care (Signed)
Immediate Anesthesia Transfer of Care Note  Patient: Edwin [name]  Procedure(s) Performed: RADIOACTIVE SEED IMPLANT/BRACHYTHERAPY IMPLANT (Prostate) SPACE OAR INSTILLATION (Perineum)  Patient Location: PACU  Anesthesia Type:General  Level of Consciousness: awake, alert , oriented and patient cooperative  Airway & Oxygen Therapy: Patient Spontanous Breathing  Post-op Assessment: Report given to RN and Post -op Vital signs reviewed and stable  Post vital signs: Reviewed and stable  Last Vitals:  Vitals Value Taken Time  BP 107/81 02/06/22 1225  Temp    Pulse 78 02/06/22 1227  Resp 14 02/06/22 1227  SpO2 96 % 02/06/22 1227  Vitals shown include unvalidated device data.  Last Pain:  Vitals:   02/06/22 0929  TempSrc: Oral  PainSc: 0-No pain      Patients Stated Pain Goal: 5 (51/83/35 8251)  Complications: No notable events documented.

## 2022-02-06 NOTE — Op Note (Signed)
Preoperative diagnosis: Clinical stage TI C adenocarcinoma the prostate   Postoperative diagnosis: Same   Procedure: I-125 prostate seed implantation, SpaceOAR placement, flexible cystoscopy  Surgeon: Lillette Boxer. Trentyn Boisclair M.D.  Radiation Oncologist: Tyler Pita, M.D.  Anesthesia: Gen.   Indications: Patient  was diagnosed with clinical stage TI C prostate cancer. We had extensive discussion with him about treatment options versus. He elected to proceed with seed implantation. He underwent consultation my office as well as with Dr. Tammi Klippel. He appeared to understand the advantages disadvantages potential risks of this treatment option. Full informed consent has been obtained.   Technique and findings: Patient was brought the operating room where he had successful induction of general anesthesia. He was placed in dorso-lithotomy position and prepped and draped in usual manner. Appropriate surgical timeout was performed. Radiation oncology department placed a transrectal ultrasound probe anchoring stand. Foley catheter with contrast in the balloon was inserted without difficulty. Anchoring needles were placed within the prostate. Rectal tube was placed. Real-time contouring of the urethra prostate and rectum were performed and the dosing parameters were established. Targeted dose was 145 gray.  I was then called  to the operating suite suite for placement of the needles. A second timeout was performed. All needle passage was done with real-time transrectal ultrasound guidance with the sagittal plane. A total of 17 needles were placed.  62 active seeds were implanted.  I then proceeded with placement of SpaceOAR by introducing a needle with the bevel angled inferiorly approximately 2 cm superior to the anus. This was angled downward and under direct ultrasound was placed within the space between the prostatic capsule and rectum. This was confirmed with a small amount of sterile saline injected and  this was performed under direct ultrasound. I then attached the SpaceOAR to the needle and injected this in the space between the prostate and rectum with good placement noted. The Foley catheter was removed and flexible cystoscopy failed to show any seeds outside the prostate.  The patient was brought to recovery room in stable condition, having tolerated the procedure well.Marland Kitchen

## 2022-02-07 ENCOUNTER — Encounter (HOSPITAL_BASED_OUTPATIENT_CLINIC_OR_DEPARTMENT_OTHER): Payer: Self-pay | Admitting: Urology

## 2022-03-06 ENCOUNTER — Telehealth: Payer: Self-pay | Admitting: *Deleted

## 2022-03-06 NOTE — Progress Notes (Signed)
Radiation Oncology         (336) [phone] ________________________________  Name: Edwin [name] MRN: [id number]  Date: 03/07/2022  DOB: 14-Nov-1955  Post-Seed Follow-Up Visit Note  CC: [facility] [place], Annie Main, MD  Diagnosis:    66 y.o. gentleman with Stage T1c adenocarcinoma of the prostate with Gleason score of 3+4, and PSA of 4.67.    ICD-10-CM   1. Malignant neoplasm of prostate ([facility])  C61       Interval Since Last Radiation:  1 months 02/06/22:  Insertion of radioactive I-125 seeds into the prostate gland; 145 Gy, definitive therapy with placement of SpaceOAR gel.  Narrative:  The patient returns today for routine follow-up.  He is complaining of increased urinary frequency and urinary hesitation symptoms. He filled out a questionnaire regarding urinary function today providing and overall IPSS score of *** characterizing his symptoms as ***.  His pre-implant score was 5. He denies any bowel symptoms.  ALLERGIES:  is allergic to percocet [oxycodone-acetaminophen].  Meds: Current Outpatient Medications  Medication Sig Dispense Refill   atorvastatin (LIPITOR) 20 MG tablet Take 20 mg by mouth daily.  3   esomeprazole (NEXIUM) 20 MG capsule 1 capsule     loratadine (CLARITIN) 10 MG tablet 1 tablet     naproxen sodium (ALEVE) 220 MG tablet 2 tablets     sildenafil (VIAGRA) 100 MG tablet 1/2-1 tablet     No current facility-administered medications for this visit.    Physical Findings: In general this is a well appearing African American male in no acute distress. He's alert and oriented x4 and appropriate throughout the examination. Cardiopulmonary assessment is negative for acute distress and he exhibits normal effort.   Lab Findings: Lab Results  Component Value Date   WBC 7.6 07/25/2016   HGB 15.5 07/25/2016   HCT 46.4 07/25/2016   MCV 83.5 07/25/2016   PLT 207 07/25/2016    Radiographic Findings:  Patient underwent CT imaging  in our clinic for post implant dosimetry. The CT will be reviewed by Dr. Tammi Klippel to confirm there is an adequate distribution of radioactive seeds throughout the prostate gland and ensure that there are no seeds in or near the rectum.  We suspect the final radiation plan and dosimetry will show appropriate coverage of the prostate gland. He understands that we will call and inform him of any unexpected findings on further review of his imaging and dosimetry.  Impression/Plan:  66 y.o. gentleman with Stage T1c adenocarcinoma of the prostate with Gleason score of 3+4, and PSA of 4.67. The patient is recovering from the effects of radiation. His urinary symptoms should gradually improve over the next 4-6 months. We talked about this today. He is encouraged by his improvement already and is otherwise pleased with his outcome. We also talked about long-term follow-up for prostate cancer following seed implant. He understands that ongoing PSA determinations and digital rectal exams will help perform surveillance to rule out disease recurrence. He had a recent follow up appointment with Jiles Crocker, NP on 02/28/22 and is scheduled for labs on 06/02/22 prior to his next visit with Dr. Diona Fanti the following week. He understands what to expect with his PSA measures. Patient was also educated today about some of the long-term effects from radiation including a small risk for rectal bleeding and possibly erectile dysfunction. We talked about some of the general management approaches to these potential complications. However, I did encourage the patient to contact our office  or return at any point if he has questions or concerns related to his previous radiation and prostate cancer.    Nicholos Johns, PA-C

## 2022-03-06 NOTE — Progress Notes (Signed)
  Radiation Oncology         (336) [phone] ________________________________  Name: Edwin [name] MRN: [id number]  Date: 03/07/2022  DOB: 10-09-1955  COMPLEX SIMULATION NOTE  NARRATIVE:  The patient was brought to the [place] suite today following prostate seed implantation approximately one month ago.  Identity was confirmed.  All relevant records and images related to the planned course of therapy were reviewed.  Then, the patient was set-up supine.  CT images were obtained.  The CT images were loaded into the planning software.  Then the prostate and rectum were contoured.  Treatment planning then occurred.  The implanted iodine 125 seeds were identified by the physics staff for projection of radiation distribution  I have requested : 3D Simulation  I have requested a DVH of the following structures: Prostate and rectum.    ________________________________  Sheral Apley Tammi Klippel, M.D.

## 2022-03-06 NOTE — Telephone Encounter (Signed)
CALLED PATIENT TO REMIND OF POST SEED APPTS. FOR 03-07-22, SPOKE WITH PATIENT AND HE IS AWARE OF THESE APPTS.

## 2022-03-07 ENCOUNTER — Encounter: Payer: Self-pay | Admitting: Urology

## 2022-03-07 ENCOUNTER — Ambulatory Visit
Admission: RE | Admit: 2022-03-07 | Discharge: 2022-03-07 | Disposition: A | Discharge status: Home / Self Care | Payer: Medicare Other | Source: Ambulatory Visit | Attending: Urology | Admitting: Urology

## 2022-03-07 ENCOUNTER — Other Ambulatory Visit: Payer: Self-pay

## 2022-03-07 ENCOUNTER — Ambulatory Visit
Admission: RE | Admit: 2022-03-07 | Discharge: 2022-03-07 | Disposition: A | Discharge status: Home / Self Care | Payer: Medicare Other | Source: Ambulatory Visit | Attending: Radiation Oncology | Admitting: Radiation Oncology

## 2022-03-07 VITALS — BP 115/70 | HR 69 | Temp 97.7°F | Resp 18 | Ht 72.0 in | Wt 231.0 lb

## 2022-03-07 DIAGNOSIS — Z79899 Other long term (current) drug therapy: Secondary | ICD-10-CM | POA: Insufficient documentation

## 2022-03-07 DIAGNOSIS — C61 Malignant neoplasm of prostate: Secondary | ICD-10-CM | POA: Insufficient documentation

## 2022-03-07 DIAGNOSIS — Z923 Personal history of irradiation: Secondary | ICD-10-CM | POA: Insufficient documentation

## 2022-03-07 DIAGNOSIS — K5900 Constipation, unspecified: Secondary | ICD-10-CM | POA: Insufficient documentation

## 2022-03-07 DIAGNOSIS — R350 Frequency of micturition: Secondary | ICD-10-CM | POA: Insufficient documentation

## 2022-03-07 NOTE — Progress Notes (Signed)
Post-seed appointment. I verified patient's identity and began nursing interview. Patient reports adjusting well to mild urinary changes. No issues reported at this time.  Meaningful use complete. I-PSS score of 15-moderate. No urinary management medications. Urology appt- Dec, 2023  BP 115/70 (BP Location: Right Arm, Patient Position: Sitting, Cuff Size: Large)   Pulse 69   Temp 97.7 F (36.5 C)   Resp 18   Ht 6' (1.829 m)   Wt 231 lb (104.8 kg)   SpO2 98%   BMI 31.33 kg/m

## 2022-03-28 ENCOUNTER — Encounter: Payer: Self-pay | Admitting: *Deleted

## 2022-04-05 ENCOUNTER — Ambulatory Visit
Admission: RE | Admit: 2022-04-05 | Discharge: 2022-04-05 | Disposition: A | Discharge status: Home / Self Care | Payer: Medicare Other | Source: Ambulatory Visit | Attending: Radiation Oncology | Admitting: Radiation Oncology

## 2022-04-05 ENCOUNTER — Encounter: Payer: Self-pay | Admitting: Radiation Oncology

## 2022-04-05 DIAGNOSIS — C61 Malignant neoplasm of prostate: Secondary | ICD-10-CM | POA: Insufficient documentation

## 2022-04-05 NOTE — Progress Notes (Signed)
  Radiation Oncology         [phone] ________________________________  Name: Edwin [name] MRN: [id number]  Date: 04/05/2022  DOB: [birth year]  3D Planning Note   Prostate Brachytherapy Post-Implant Dosimetry  Diagnosis: 66 y.o. gentleman with Stage T1c adenocarcinoma of the prostate with Gleason score of 3+4, and PSA of 4.67.  Narrative: On a previous date, Edwin [name] returned following prostate seed implantation for post implant planning. He underwent CT scan complex simulation to delineate the three-dimensional structures of the pelvis and demonstrate the radiation distribution.  Since that time, the seed localization, and complex isodose planning with dose volume histograms have now been completed.  Results:   Prostate Coverage - The dose of radiation delivered to the 90% or more of the prostate gland (D90) was 115.95% of the prescription dose. This exceeds our goal of greater than 90%. Rectal Sparing - The volume of rectal tissue receiving the prescription dose or higher was 0.0 cc. This falls under our thresholds tolerance of 1.0 cc.  Impression: The prostate seed implant appears to show adequate target coverage and appropriate rectal sparing.  Plan:  The patient will continue to follow with urology for ongoing PSA determinations. I would anticipate a high likelihood for local tumor control with minimal risk for rectal morbidity.  ________________________________  Sheral Apley Tammi Klippel, M.D.

## 2022-04-17 ENCOUNTER — Inpatient Hospital Stay: Payer: Medicare Other | Attending: Radiation Oncology | Admitting: *Deleted

## 2022-04-17 DIAGNOSIS — C61 Malignant neoplasm of prostate: Secondary | ICD-10-CM | POA: Insufficient documentation

## 2022-04-18 NOTE — Progress Notes (Addendum)
2 Identifiers used for verification purposes only. No vitals taken as this was a telephone visit. Pt denies pain and fatigue. Pt does still have some urinary hesitancy but does make sure he empties bladder completely with each void.  No burning with urination.Pt says frequency and urgency are getting better. Pt gets up to bathroom ~ twice. No issues with bowels. Pt denies smoking and drinks on special occasion. Pt exercises by riding stationary bike and doing yardwork.Pt will see PCP in [place] colonoscopy was in July 2023. Repeat in 5 years. Pt received flu vaccine through the [employer] in October.. We discussed the  "[employer]". Next urology appt in Dec. SCP reviewed and completed.

## 2023-03-21 ENCOUNTER — Other Ambulatory Visit: Payer: Self-pay

## 2023-03-21 ENCOUNTER — Emergency Department (HOSPITAL_COMMUNITY): Payer: Medicare Other

## 2023-03-21 ENCOUNTER — Emergency Department (HOSPITAL_COMMUNITY)
Admission: EM | Admit: 2023-03-21 | Discharge: 2023-03-22 | Disposition: A | Discharge status: Home / Self Care | Payer: Medicare Other

## 2023-03-21 DIAGNOSIS — R072 Precordial pain: Secondary | ICD-10-CM | POA: Insufficient documentation

## 2023-03-21 DIAGNOSIS — Z87891 Personal history of nicotine dependence: Secondary | ICD-10-CM | POA: Insufficient documentation

## 2023-03-21 DIAGNOSIS — M542 Cervicalgia: Secondary | ICD-10-CM | POA: Insufficient documentation

## 2023-03-21 DIAGNOSIS — R079 Chest pain, unspecified: Secondary | ICD-10-CM | POA: Diagnosis present

## 2023-03-21 DIAGNOSIS — Z8546 Personal history of malignant neoplasm of prostate: Secondary | ICD-10-CM | POA: Insufficient documentation

## 2023-03-21 LAB — BASIC METABOLIC PANEL
Anion gap: 6 (ref 5–15)
BUN: 16 mg/dL (ref 8–23)
CO2: 23 mmol/L (ref 22–32)
Calcium: 8.7 mg/dL — ABNORMAL LOW (ref 8.9–10.3)
Chloride: 109 mmol/L (ref 98–111)
Creatinine, Ser: 1.02 mg/dL (ref 0.61–1.24)
GFR, Estimated: >60 mL/min (ref >60)
Glucose, Bld: 109 mg/dL — ABNORMAL HIGH (ref 70–99)
Potassium: 3.9 mmol/L (ref 3.5–5.1)
Sodium: 138 mmol/L (ref 135–145)

## 2023-03-21 LAB — CBC
HCT: 47.2 % (ref 39.0–52.0)
Hemoglobin: 15.1 g/dL (ref 13.0–17.0)
MCH: 27.4 pg (ref 26.0–34.0)
MCHC: 32 g/dL (ref 30.0–36.0)
MCV: 85.5 fL (ref 80.0–100.0)
Platelets: 164 10*3/uL (ref 150–400)
RBC: 5.52 MIL/uL (ref 4.22–5.81)
RDW: 13.7 % (ref 11.5–15.5)
WBC: 6.4 10*3/uL (ref 4.0–10.5)
nRBC: 0 % (ref 0.0–0.2)

## 2023-03-21 LAB — TROPONIN I (HIGH SENSITIVITY)
Troponin I (High Sensitivity): 8 ng/L (ref <18)
Troponin I (High Sensitivity): 8 ng/L (ref <18)

## 2023-03-21 NOTE — ED Triage Notes (Addendum)
Pt reports neck pain that radiates down left arm and left back "Started this evening":

## 2023-03-21 NOTE — ED Provider Notes (Signed)
[facility] AT [facility] Provider Note   CSN: [id number] Arrival date & time: 03/21/23  1740     History  Chief Complaint  Patient presents with   Neck Pain    Edwin [name] is a 67 y.o. male.  Patient with history of GERD, hyperlipidemia, prostate cancer in remission presents today with complaints of chest pain. He states that same began all of a sudden around 4 pm this evening. The pain lasted around 10 minutes and then resolved. It was sharp in nature and radiated into his left neck and down his left arm. He denies shortness of breath but does note that the pain 'took my breath away.' He notes associated diaphoresis during this episode. His symptoms resolved spontaneously without intervention after approximately 10 minutes and have not returned. He denies any symptoms currently. He states that he has had 1 previous episode of similar symptoms 1 week ago, however given that it was an isolated incident and resolved on its own without intervention after only a few minutes he did not seek evaluation or treatment. Denies any current chest pain or shortness of breath. No fevers, chills, cough, congestion, nausea, vomiting, diarrhea, or abdominal pain. No leg pain or leg swelling. No recent travel or surgeries. No PND or orthopnea. Does note that he had a 'short stint of afib' several years ago and saw cardiology for this and was evaluated without acute findings. He was briefly on a DOAC but this was continued many years ago and he has not seen cardiology since then. He no longer smokes, quit in 2001.  The history is provided by the patient. No language interpreter was used.  Neck Pain Associated symptoms: chest pain (resolved)        Home Medications Prior to Admission medications   Medication Sig Start Date End Date Taking? Authorizing Provider  atorvastatin (LIPITOR) 20 MG tablet Take 20 mg by mouth daily. 06/28/16  Yes [provider]   esomeprazole (NEXIUM) 20 MG capsule Take 20 mg by mouth daily before breakfast.   Yes [provider]  loratadine (CLARITIN) 10 MG tablet Take 10 mg by mouth daily as needed for allergies.   Yes [provider]  naproxen sodium (ALEVE) 220 MG tablet Take 440 mg by mouth in the morning.   Yes [provider]  sildenafil (VIAGRA) 100 MG tablet Take 100 mg by mouth daily as needed (as directed).   Yes [provider]  traZODone (DESYREL) 100 MG tablet Take 100 mg by mouth at bedtime as needed for sleep.   Yes [provider]      Allergies    Percocet [oxycodone-acetaminophen]    Review of Systems   Review of Systems  Cardiovascular:  Positive for chest pain (resolved).  All other systems reviewed and are negative.   Physical Exam Updated Vital Signs BP 125/75   Pulse (!) 58   Temp 98.6 F (37 C) (Oral)   Resp 15   Ht 6' (1.829 m)   Wt 105.2 kg   SpO2 98%   BMI 31.46 kg/m  Physical Exam Vitals and nursing note reviewed.  Constitutional:      General: He is not in acute distress.    Appearance: Normal appearance. He is normal weight. He is not ill-appearing, toxic-appearing or diaphoretic.  HENT:     Head: Normocephalic and atraumatic.  Neck:     Comments: No tenderness to palpation of the midline cervical spine or surrounding neck musculature  Cardiovascular:     Rate and Rhythm: Normal rate and regular rhythm.     Heart sounds: Normal heart sounds.  Pulmonary:     Effort: Pulmonary effort is normal. No respiratory distress.     Breath sounds: Normal breath sounds.  Abdominal:     General: Abdomen is flat.     Palpations: Abdomen is soft.     Tenderness: There is no abdominal tenderness.  Musculoskeletal:        General: No tenderness. Normal range of motion.     Cervical back: Normal range of motion and neck supple. No rigidity or tenderness.     Right lower leg: No edema.     Left lower leg: No edema.  Lymphadenopathy:      Cervical: No cervical adenopathy.  Skin:    General: Skin is warm and dry.  Neurological:     General: No focal deficit present.     Mental Status: He is alert.  Psychiatric:        Mood and Affect: Mood normal.        Behavior: Behavior normal.     ED Results / Procedures / Treatments   Labs (all labs ordered are listed, but only abnormal results are displayed) Labs Reviewed  BASIC METABOLIC PANEL - Abnormal; Notable for the following components:      Result Value   Glucose, Bld 109 (*)    Calcium 8.7 (*)    All other components within normal limits  CBC  TROPONIN I (HIGH SENSITIVITY)  TROPONIN I (HIGH SENSITIVITY)    EKG None  Radiology DG Chest 2 View  Result Date: 03/21/2023 CLINICAL DATA:  Chest pain EXAM: CHEST - 2 VIEW COMPARISON:  07/25/2016 FINDINGS: The heart size and mediastinal contours are within normal limits. Both lungs are clear. The visualized skeletal structures are unremarkable. IMPRESSION: No active cardiopulmonary disease. Electronically Signed   By: Helyn Numbers M.D.   On: 03/21/2023 20:22    Procedures Procedures    Medications Ordered in ED Medications - No data to display  ED Course/ Medical Decision Making/ A&P                                 Medical Decision Making Amount and/or Complexity of Data Reviewed Labs: ordered. Radiology: ordered.   This patient is a 67 y.o. male who presents to the ED for concern of chest pain, this involves an extensive number of treatment options, and is a complaint that carries with it a high risk of complications and morbidity. The emergent differential diagnosis prior to evaluation includes, but is not limited to,  ACS, pericarditis, myocarditis, aortic dissection, PE, pneumothorax, esophageal spasm or rupture, chronic angina, pneumonia, bronchitis, GERD, reflux/PUD, biliary disease, pancreatitis, costochondritis, anxiety    This is not an exhaustive differential.   Past Medical History /  Co-morbidities / Social History:  has a past medical history of Arthritis, GERD (gastroesophageal reflux disease), and Hypercholesteremia.  Physical Exam: Physical exam performed. The pertinent findings include: no abnormal physical exam findings  Lab Tests: I ordered, and personally interpreted labs.  The pertinent results include:  no acute laboratory abnormalities   Imaging Studies: I ordered imaging studies including CXR. I independently visualized and interpreted imaging which showed NAD. I agree with the radiologist interpretation.   Cardiac Monitoring:  The patient was maintained on a cardiac monitor.  Cardiac monitor showed an underlying rhythm of: sinus rhythm, no  STEMI. I agree with this interpretation.  Disposition: After consideration of the diagnostic results and the patients response to treatment, I feel that emergency department workup does not suggest an emergent condition requiring admission or immediate intervention beyond what has been performed at this time. The plan is: Discharge with close outpatient follow-up and return precautions.  Patient's heart score is 3, low risk.  Upon reevaluation, patient continues to be symptom-free.  He is afebrile, nontoxic-appearing, and in no acute distress with reassuring vital signs.  No signs or symptoms to suggest PE or dissection.  Recommend close outpatient follow-up with his PCP, may need repeat evaluation by his cardiologist as well if these episodes continue. Evaluation and diagnostic testing in the emergency department does not suggest an emergent condition requiring admission or immediate intervention beyond what has been performed at this time.  Plan for discharge with close PCP follow-up.  Patient is understanding and amenable with plan, educated on red flag symptoms that would prompt immediate return.  Patient discharged in stable condition.  Final Clinical Impression(s) / ED Diagnoses Final diagnoses:  Precordial chest pain     Rx / DC Orders ED Discharge Orders     None     An After Visit Summary was printed and given to the patient.     Vear Clock 03/21/23 2355    Durwin Glaze, MD 03/22/23 [phone]

## 2023-03-21 NOTE — Discharge Instructions (Signed)
As we discussed, your workup in the ER today was reassuring for acute findings.  Laboratory evaluation, chest x-ray, and EKG did not reveal any emergent cause of your symptoms.  Given that you have continued to be asymptomatic, no additional evaluation is indicated at this time.  I do recommend that you monitor your symptoms closely and return for any new or worsening symptoms.  Please call your primary doctor as well to schedule a follow-up appointment at your earliest convenience.
